# Patient Record
Sex: Male | Born: 1935 | Race: White | Hispanic: No | Marital: Married | Smoking: Never smoker
Health system: Southern US, Community
[De-identification: ages and names within clinical notes are randomized; demographics above are authoritative.]

## PROBLEM LIST (undated history)

## (undated) DIAGNOSIS — IMO0001 Reserved for inherently not codable concepts without codable children: Secondary | ICD-10-CM

## (undated) DIAGNOSIS — K219 Gastro-esophageal reflux disease without esophagitis: Secondary | ICD-10-CM

## (undated) DIAGNOSIS — I2510 Atherosclerotic heart disease of native coronary artery without angina pectoris: Secondary | ICD-10-CM

## (undated) DIAGNOSIS — I4891 Unspecified atrial fibrillation: Secondary | ICD-10-CM

## (undated) DIAGNOSIS — I059 Rheumatic mitral valve disease, unspecified: Secondary | ICD-10-CM

## (undated) DIAGNOSIS — I10 Essential (primary) hypertension: Secondary | ICD-10-CM

## (undated) DIAGNOSIS — I639 Cerebral infarction, unspecified: Secondary | ICD-10-CM

## (undated) DIAGNOSIS — I499 Cardiac arrhythmia, unspecified: Secondary | ICD-10-CM

## (undated) DIAGNOSIS — I209 Angina pectoris, unspecified: Secondary | ICD-10-CM

## (undated) DIAGNOSIS — I509 Heart failure, unspecified: Secondary | ICD-10-CM

## (undated) HISTORY — PX: EYE SURGERY: SHX253

## (undated) HISTORY — PX: CHOLECYSTECTOMY: SHX55

## (undated) HISTORY — PX: TONSILLECTOMY: SUR1361

## (undated) HISTORY — PX: OTHER SURGICAL HISTORY: SHX169

---

## 2004-04-18 ENCOUNTER — Ambulatory Visit: Payer: Self-pay | Admitting: Unknown Physician Specialty

## 2007-04-20 ENCOUNTER — Emergency Department: Payer: Self-pay | Admitting: Emergency Medicine

## 2007-04-23 ENCOUNTER — Ambulatory Visit: Payer: Self-pay | Admitting: Emergency Medicine

## 2007-06-18 ENCOUNTER — Ambulatory Visit: Payer: Self-pay | Admitting: Surgery

## 2007-06-25 ENCOUNTER — Inpatient Hospital Stay: Payer: Self-pay | Admitting: Surgery

## 2011-06-01 ENCOUNTER — Inpatient Hospital Stay: Payer: Self-pay | Admitting: Internal Medicine

## 2011-06-01 LAB — COMPREHENSIVE METABOLIC PANEL
Albumin: 3.4 g/dL (ref 3.4–5.0)
BUN: 22 mg/dL — ABNORMAL HIGH (ref 7–18)
Bilirubin,Total: 0.7 mg/dL (ref 0.2–1.0)
Chloride: 105 mmol/L (ref 98–107)
Co2: 26 mmol/L (ref 21–32)
Creatinine: 1.15 mg/dL (ref 0.60–1.30)
Glucose: 103 mg/dL — ABNORMAL HIGH (ref 65–99)
Potassium: 4 mmol/L (ref 3.5–5.1)
SGPT (ALT): 33 U/L
Sodium: 139 mmol/L (ref 136–145)

## 2011-06-01 LAB — CK TOTAL AND CKMB (NOT AT ARMC)
CK, Total: 44 U/L (ref 35–232)
CK-MB: 1.8 ng/mL (ref 0.5–3.6)
CK-MB: 1.4 ng/mL (ref 0.5–3.6)

## 2011-06-01 LAB — CBC
MCH: 34 pg (ref 26.0–34.0)
MCHC: 34.5 g/dL (ref 32.0–36.0)
Platelet: 149 10*3/uL — ABNORMAL LOW (ref 150–440)
RDW: 14.1 % (ref 11.5–14.5)

## 2011-06-01 LAB — TSH: Thyroid Stimulating Horm: 6.01 u[IU]/mL — ABNORMAL HIGH

## 2011-06-01 LAB — T4, FREE: Free Thyroxine: 0.88 ng/dL (ref 0.76–1.46)

## 2011-06-01 LAB — TROPONIN I: Troponin-I: 0.03 ng/mL

## 2011-06-02 LAB — CBC WITH DIFFERENTIAL/PLATELET
Basophil %: 0.5 %
HGB: 14.8 g/dL (ref 13.0–18.0)
MCH: 33 pg (ref 26.0–34.0)
Monocyte %: 11 %
Neutrophil #: 4.4 10*3/uL (ref 1.4–6.5)
Platelet: 142 10*3/uL — ABNORMAL LOW (ref 150–440)

## 2011-06-02 LAB — BASIC METABOLIC PANEL
Anion Gap: 7 (ref 7–16)
Chloride: 100 mmol/L (ref 98–107)
Co2: 30 mmol/L (ref 21–32)
EGFR (African American): 60 — ABNORMAL LOW
EGFR (Non-African Amer.): 52 — ABNORMAL LOW
Glucose: 87 mg/dL (ref 65–99)
Potassium: 4.2 mmol/L (ref 3.5–5.1)

## 2012-04-18 ENCOUNTER — Observation Stay: Payer: Self-pay | Admitting: Internal Medicine

## 2012-04-18 LAB — COMPREHENSIVE METABOLIC PANEL
Albumin: 3.1 g/dL — ABNORMAL LOW (ref 3.4–5.0)
Alkaline Phosphatase: 76 U/L (ref 50–136)
Anion Gap: 7 (ref 7–16)
BUN: 28 mg/dL — ABNORMAL HIGH (ref 7–18)
Calcium, Total: 8 mg/dL — ABNORMAL LOW (ref 8.5–10.1)
EGFR (African American): 43 — ABNORMAL LOW
EGFR (Non-African Amer.): 37 — ABNORMAL LOW
Glucose: 96 mg/dL (ref 65–99)
SGOT(AST): 27 U/L (ref 15–37)
SGPT (ALT): 17 U/L (ref 12–78)
Sodium: 139 mmol/L (ref 136–145)
Total Protein: 7 g/dL (ref 6.4–8.2)

## 2012-04-18 LAB — CBC
HCT: 40.4 % (ref 40.0–52.0)
HGB: 13.4 g/dL (ref 13.0–18.0)
MCH: 32.7 pg (ref 26.0–34.0)
MCHC: 33.3 g/dL (ref 32.0–36.0)
Platelet: 145 10*3/uL — ABNORMAL LOW (ref 150–440)
RDW: 13.9 % (ref 11.5–14.5)

## 2012-04-18 LAB — TROPONIN I
Troponin-I: <0.02 ng/mL
Troponin-I: <0.02 ng/mL

## 2012-04-18 LAB — CK TOTAL AND CKMB (NOT AT ARMC): CK-MB: 1.5 ng/mL (ref 0.5–3.6)

## 2012-04-19 LAB — MAGNESIUM: Magnesium: 2.1 mg/dL

## 2012-04-19 LAB — BASIC METABOLIC PANEL
Anion Gap: 4 — ABNORMAL LOW (ref 7–16)
BUN: 33 mg/dL — ABNORMAL HIGH (ref 7–18)
Calcium, Total: 9.1 mg/dL (ref 8.5–10.1)
Chloride: 103 mmol/L (ref 98–107)
Creatinine: 1.53 mg/dL — ABNORMAL HIGH (ref 0.60–1.30)
EGFR (African American): 45 — ABNORMAL LOW
Potassium: 4.5 mmol/L (ref 3.5–5.1)
Sodium: 138 mmol/L (ref 136–145)

## 2012-04-19 LAB — LIPID PANEL
Cholesterol: 168 mg/dL (ref 0–200)
HDL Cholesterol: 54 mg/dL (ref 40–60)
Ldl Cholesterol, Calc: 102 mg/dL — ABNORMAL HIGH (ref 0–100)
Triglycerides: 61 mg/dL (ref 0–200)
VLDL Cholesterol, Calc: 12 mg/dL (ref 5–40)

## 2014-05-26 NOTE — H&P (Signed)
PATIENT NAME:  Randy Chambers, Jasmeet W MR#:  [id number] DATE OF BIRTH:  10-Feb-1916  DATE OF ADMISSION:  04/18/2012  PRIMARY CARE PHYSICIAN:   Dr.  Beckey DowningWillett.   REFERRING PHYSICIAN:  Dr.  Margarita GrizzleWoodruff.  CHIEF COMPLAINT: Chest pain and shortness of breath since last night.   HISTORY OF PRESENT ILLNESS: A 81 year old Caucasian male with a history of hypertension, CHF, atrial flutter, GERD, presented to the ED with the above chief complaints. The patient is alert, awake, oriented, in no acute distress. The patient has said that he developed a pressure-like chest pain on the left side which is constant, associated with shortness of breath and a cough since last night. The patient denies any orthopnea, nocturnal dyspnea. Denies leg edema. Denies any weight gain. The patient denies any fever or chills. No palpitations. The patient's blood pressure, systolic, was 207 in the ED. Chest x-ray showed pulmonary edema. He was treated with nitroglycerin in the ED.   PAST MEDICAL HISTORY: Hypertension, CHF, diastolic dysfunction with ejection more than 55%, atrial flutter, GERD.   SOCIAL HISTORY: No smoking or drinking or illicit drugs.   FAMILY HISTORY: Hypertension, CAD.   SURGICAL HISTORY: No.  ALLERGIES: No.   HOME MEDICATIONS:  Trandolapril 4 mg p.o. daily, Prevacid 30 mg p.o. daily, Lasix 20 mg p.o. daily, aspirin 81 mg p.o. daily.   REVIEW OF SYSTEMS:  CONSTITUTIONAL: The patient denies any fever or chills. No headache or dizziness. No weakness.  EYES: No double vision or blurry vision.  ENT: No postnasal drip, slurred speech, or dysphagia. No epistaxis.  CARDIOVASCULAR: Positive for chest pressure, but no palpitations, orthopnea, or nocturnal dyspnea. No leg edema.  PULMONARY: Positive for cough, sputum, shortness of breath, but no hemoptysis.  GASTROINTESTINAL: No abdominal pain, nausea, vomiting, or diarrhea.  GENITOURINARY: No dysuria, hematuria, or incontinence.  SKIN: No rash or jaundice.   HEMATOLOGIC: No easy bruising or bleeding.  ENDOCRINE: No polyuria, polydipsia.  NEUROLOGIC: No syncope, loss of consciousness. No seizure.   PHYSICAL EXAMINATION: VITAL SIGNS: Temperature 98.2, blood pressure 163/79, pulse 63, respirations 18, O2 saturation 97% on room air.  GENERAL: The patient is alert, awake, oriented, in no acute distress.  HEENT: Pupils round, equal, reactive to light and accommodation. Moist oral mucosa. Clear oropharynx.  NECK: Supple. No JVD or carotid bruits. No lymphadenopathy. No thyromegaly.  CARDIOVASCULAR: S1, S2. Regular rate and rhythm. There is a systolic murmur about 3/6, radiation to the neck. No gallop. PULMONARY: Bilateral air entry. No wheezing or rales. No use of accessory muscles to breathe.  ABDOMEN: Soft. No distention or tenderness. No organomegaly. Bowel sounds present.  EXTREMITIES: Trace edema on bilateral lower extremeties, no clubbing, or cyanosis. No calf tenderness. Strong bilateral pedal pulses.  SKIN: No rash or jaundice.  NEUROLOGY: A  and O x3. No focal deficit. Power 5/5. Sensation intact.   LABORATORY DATA: Troponin less than 0.02. CK-MB 1.5, CK 77. CBC: WBC 7.2, hemoglobin 13.4, platelets 145, glucose 96, BUN 28, creatinine 1.58. Sodium 139, potassium 4.6, chloride 109, bicarbonate 23. BNP 5127. Chest x-ray shows pulmonary edema, possible small bilateral pleural effusions. Infection is not excluded. EKG shows atrial flutter at 65 BPM.   IMPRESSIONS: 1.  Acute on chronic congestive heart failure, diastolic dysfunction.  2.  Atrial flutter, rate controlled.  3.  Accelerated hypertension.  4.  Chest pain, possibly due to hypertension or atrial flutter.  5.  Acute renal failure.  6.  Coronary artery disease.  7.  Gastroesophageal reflux disease.  PLAN OF TREATMENT: 1.  The patient will be admitted to the telemetry floor. We will start O2 by nasal cannula. Follow up a troponin level. Continue aspirin, lisinopril, and check lipid  panel.  2.  We will start congestive heart failure protocol, give Lasix 40 mg IV. Follow up with Cardiology, Dr. Cleatis Polka.  3.  We will follow up BMP for acute renal failure.   I discussed the patient's condition and the plan of treatment with the patient and the patient's granddaughter.   THE PATIENT IS A FULL CODE.   TIME SPENT: About 55 minutes.    ____________________________ Shaune Pollack, MD qc:dm D: 04/18/2012 14:21:00 ET T: 04/18/2012 17:15:28 ET JOB#: [id number]  cc: Shaune Pollack, MD, <Dictator> Shaune Pollack MD ELECTRONICALLY SIGNED 04/19/2012 12:54

## 2014-05-26 NOTE — Consult Note (Signed)
Brief Consult Note: Diagnosis: 81 yo male with aflutter admitted with chest pain.   Patient was seen by consultant.   Consult note dictated.   Recommend further assessment or treatment.   Comments: 81 yo male with history of aflutter admitted with chest pain. EKG does not reveal any signficant ischemia. Has ruled out for an mi thus far. Will review out patient workup and proceed with a non invasive evaluation to include echo initially. Continue current meds for rate control and -pain relief. Further recs pending course.  Electronic Signatures: Dalia HeadingFath, Crixus Mcaulay A (MD)  (Signed 17-Mar-14 07:56)  Authored: Brief Consult Note   Last Updated: 17-Mar-14 07:56 by Dalia HeadingFath, Lesha Jager A (MD)

## 2014-05-26 NOTE — Discharge Summary (Signed)
PATIENT NAME:  Randy Chambers, Paxson W MR#:  [id number] DATE OF BIRTH:  09/18/1916  DATE OF ADMISSION:  04/18/2012 DATE OF DISCHARGE:  04/19/2012  PRIMARY CARE PHYSICIAN:  Barry BrunnerGlenn Willett.  DISCHARGE DIAGNOSES: 1.  Acute on chronic diastolic congestive heart failure.  2.  Atrial flutter.  3.  Accelerated hypertension.  4.  Chest pain secondary to atrial flutter.  5.  Chronic kidney disease stage III.  6.  Coronary artery disease.  7.  Gastroesophageal reflux disease.   CONSULTS: Dr. Lady GaryFath of Cardiology.   IMAGING STUDIES DONE: Include a chest x-ray, portable, which showed pulmonary edema with small bilateral pleural effusions.   ADMITTING HISTORY AND PHYSICAL AND HOSPITAL COURSE: Please see detailed  H and P dictated by Dr. Imogene Burnhen on 04/18/2012. In brief, a 81 year old Caucasian male patient with history of coronary artery disease, CHF, who presented to the hospital complaining of shortness of breath and chest pain. The patient was admitted to the hospitalist service with CHF and rule out acute coronary syndrome.   HOSPITAL COURSE: 1.  Chest pain: This was thought to be secondary to his acute on chronic diastolic congestive heart failure. The patient was diuresed with IV Lasix, with which his symptoms improved. His chest pain had resolved, and the patient is being discharged home on 40 mg of Lasix instead of 20 and that he was taking at home. He was also advised fluid restriction and a low-salt diet.  2.  Acute on chronic diastolic congestive heart failure: See above. The patient was also started on lisinopril and Coreg for his hypertension and congestive heart failure.  3.  Uncontrolled hypertension: Well controlled on the medication started. Blood pressure was 139/81 prior to discharge.   On the day of discharge, the patient worked with physical therapy. Advised to have further physical therapy with home health. Blood pressure was 139/79. Examination showed a pan-systolic murmur, with echo showing  severe mitral regurgitation. The patient was discharged home in fair condition, to follow up with Dr. Lady GaryFath of Cardiology.   DISCHARGE MEDICATIONS: Include: 1.  Aspirin 81 mg oral once a day.  2.  Prevacid 30 mg oral once a day.  3.  Trandolapril 4 mg oral once a day.  4.  Lasix 40 mg oral once a day.  5.  Coreg 3.125 mg oral twice a day.   DISCHARGE INSTRUCTIONS: The patient was set up with home health for physical therapy and nurse at home. Low-sodium, fluid-restricted diet. Activity as tolerated using his walker.   Follow up with primary care physician and Dr. Lady GaryFath in 1 to 2 weeks.   Time spent on day of discharge and discharge activity was 35 minutes.     ____________________________ Molinda BailiffSrikar R. Jeweldean Drohan, MD srs:dm D: 04/20/2012 13:49:00 ET T: 04/20/2012 14:07:39 ET JOB#: [id number]  cc: Sherrine MaplesGlenn R. Beckey DowningWillett, MD Darlin PriestlyKenneth A. Lady GaryFath, MD Lorilynn Lehr R. Jameis Newsham, MD, <Dictator>   Orie FishermanSRIKAR R Abdelrahman Nair MD ELECTRONICALLY SIGNED 04/22/2012 13:21

## 2014-05-26 NOTE — Consult Note (Signed)
General Aspect 81 yo male with history of atrial flutter, diastollic heart failure with echo in the past revealing ef of 55%, hypertension who was admitted aftrer waking up from sleep sunday am with mid sternal chest pain. He states he has not felt this previously. He presented to the er where he was noted to have a normal troponin with no evidence of significant chf. He reports compliance with his medications. He has had no further pain since his admission and is resting comfortably at present. Has not noted exertional pain recently . Has history of mitral valve disease with echo in 2013 revealing severe mr with preserved lv funciton   Physical Exam:  GEN well developed, well nourished, no acute distress   HEENT hearing intact to voice   NECK supple   RESP normal resp effort  clear BS  no use of accessory muscles   CARD Irregular rate and rhythm  Normal, S1, S2  Murmur   Murmur Systolic   Systolic Murmur axilla   ABD denies tenderness  no hernia   LYMPH negative neck, negative axillae   EXTR negative cyanosis/clubbing, negative edema   SKIN normal to palpation, No rashes   NEURO cranial nerves intact, motor/sensory function intact   PSYCH A+O to time, place, person   Review of Systems:  Subjective/Chief Complaint midsternal chest pain   General: No Complaints   Skin: No Complaints   ENT: No Complaints   Eyes: No Complaints   Neck: No Complaints   Respiratory: No Complaints   Cardiovascular: Chest pain or discomfort  Tightness   Gastrointestinal: No Complaints   Genitourinary: No Complaints   Vascular: No Complaints   Musculoskeletal: No Complaints   Neurologic: No Complaints   Hematologic: No Complaints   Endocrine: No Complaints   Psychiatric: No Complaints   Review of Systems: All other systems were reviewed and found to be negative   Medications/Allergies Reviewed Medications/Allergies reviewed     CHF:    htn:    acid reflux:     Prostate Reduction:    Esophageal Dilation:    Tonsillectomy:        Admit Diagnosis:   CHEST PAIN: Onset Date: 19-Apr-2012, Status: Active, Description: CHEST PAIN  Home Medications: Medication Instructions Status  aspirin 81 mg oral tablet 1 tab(s) orally once a day Active  Prevacid 30 mg oral delayed release capsule 1 cap(s) orally once a day Active  furosemide 20 mg oral tablet 1 tab(s) orally once a day Active  trandolapril 4 mg oral tablet 1 tab(s) orally once a day for high blood pressure. Active   EKG:  Abnormal NSSTTW changes   Interpretation afib with nssttw changes.    No Known Allergies:    Impression 81 yo male with history of afib/flutter, hypertension, diastollic chf who was admitted with chest pain and shortness of breath. CXR reveals bilateral small effusions consistant with chf. Based on previous echo, this appears to be diastollic chf. He also has a 3/6 systollic murmur which may be exacerbating his chf. Will need echo to better assess his lv funciton and valves.  Appears to have ruled out for an mi thus far. Has CKD III.   Plan 1. Continue with current meds 2. Careful diuresis following electrolytes and renal function 3. Echo to evaluate lv funciton, diastology and valvular function 4. Furhter recs pending coursse.   Electronic Signatures: Dalia Heading (MD)  (Signed 17-Mar-14 09:36)  Authored: General Aspect/Present Illness, History and Physical Exam, Review of System,  Past Medical History, Health Issues, Home Medications, EKG , Allergies, Impression/Plan   Last Updated: 17-Mar-14 09:36 by Dalia HeadingFath, Kenneth A (MD)

## 2014-05-28 NOTE — Consult Note (Signed)
General Aspect 81 year old male with history of valvular heart disease and hypertension who was admitted after presenting to the emergency room with increasing shortness of breath. Patient is a somewhat difficult historian but noted increasing shortness of breath of the last several weeks. This apparently worsened on day of admission. Patient is ruled out for myocardial infarction. Chest x-ray does not reveal significant congestive heart failure. He does have a systolic murmur on exam. He is currently hemodynamically stable and resting comfortably in bed. He denies any further shortness of breath. He denies chest pain. He states he has been compliant with his medications.he was noted to have what was felt to be atrial flutter on admission EKG   Physical Exam:   GEN no acute distress    HEENT PERRL, hearing intact to voice    NECK supple    RESP clear BS  no use of accessory muscles    CARD Regular rate and rhythm  Murmur    Murmur Systolic    Systolic Murmur Out flow    ABD denies tenderness  normal BS  no Abdominal Bruits    LYMPH negative neck, negative axillae    EXTR negative cyanosis/clubbing, negative edema    SKIN normal to palpation    NEURO cranial nerves intact, motor/sensory function intact    PSYCH A+O to time, place, person   Review of Systems:   Subjective/Chief Complaint shortness of breath and fatigue    General: Fatigue    Skin: No Complaints    ENT: No Complaints    Eyes: No Complaints    Neck: No Complaints    Respiratory: Short of breath    Cardiovascular: No Complaints    Gastrointestinal: No Complaints    Genitourinary: No Complaints    Vascular: No Complaints    Musculoskeletal: No Complaints    Neurologic: No Complaints    Hematologic: No Complaints    Endocrine: No Complaints    Psychiatric: No Complaints    Review of Systems: All other systems were reviewed and found to be negative    Medications/Allergies Reviewed  Medications/Allergies reviewed        Admit Diagnosis:   CHF A FLUTTER: 01-Jun-2011, Active, CHF A FLUTTER      Admit Reason:   Nausea: (787.02) Active, ICD9, Nausea alone   Chest pain: (786.50) Active, ICD9, Unspecified chest pain   CHF (congestive heart failure): (428.0) Active, ICD9, Congestive heart failure, unspecified   Atrial flutter: (427.32) Active, ICD9, Atrial flutter  Home Medications: Medication Instructions Status  aspirin 81 mg oral tablet 1 tab(s) orally once a day Active  Prevacid 30 mg oral delayed release capsule 1 cap(s) orally once a day Active  trandolapril 4 mg oral tablet 1 tab(s) orally once a day Active   EKG:   Interpretation sinus rhythm with intermittent atrial flutter/fibrillation    No Known Allergies:     Impression 81 year old male with presentation with increasing shortness of breath. Admission electrocardiogram revealed possible atrial flutter. He also was noted to have borderline pulmonary edema on chest x-ray. On exam he has a systolic murmur. Patient is had an irregular heartbeat in the past but is not aware of ever having the diagnosis of atrial fibrillation/flutter. EKG at present on telemetry reveals sinus rhythm with first-degree AV block. He is currently hemodynamically stable. He is ruled out for myocardial infarction thus far. Would not proceed with chronic anticoagulation at present following for evidence of significant arrhythmia. We'll also compare echocardiogram done in the office  to evaluate extent of valvular heart disease    Plan 1. Continue with current medical therapy ruling out for myocardial infarction 2. Continue on telemetry following for evidence of arrhythmia 3. Would not proceed with chronic anticoagulation at present unless the patient develops further sustained atrial arrhythmia 4. Further recommendations pending hospital course   Electronic Signatures: Dalia HeadingFath, Wiletta Bermingham A (MD)  (Signed 28-Apr-13 16:16)  Authored: General  Aspect/Present Illness, History and Physical Exam, Review of System, Health Issues, Home Medications, EKG , Allergies, Impression/Plan   Last Updated: 28-Apr-13 16:16 by Dalia HeadingFath, Mabeline Varas A (MD)

## 2014-05-28 NOTE — Discharge Summary (Signed)
PATIENT NAME:  Randy Chambers, Randy Chambers MR#:  [id number] DATE OF BIRTH:  24-Jan-1917  DATE OF ADMISSION:  06/01/2011 DATE OF DISCHARGE:  06/02/2011  ADMITTING DIAGNOSIS: Chest pain.   DISCHARGE DIAGNOSES:  1. Chest pain, negative cardiac enzymes for injury.  2. Known coronary artery disease, stable at this time. No further evaluation recommended by Cardiology.  3. Atrial flutter, slow ventricular response.  4. Congestive heart failure, left heart, acute.  5. Nausea, resolved. 6. History of hypertension.   DISCHARGE CONDITION: Stable.   DISCHARGE MEDICATIONS: The patient is to resume his outpatient medications which are:  1. Aspirin 81 mg p.o. daily.  2. Prevacid 30 mg p.o. daily.   ADDITIONAL MEDICATIONS:  1. Trandolapril 4 mg p.o. twice daily, this is new dose. 2. Lasix 20 mg p.o. daily.  HOME OXYGEN: None.   DIET: 2 gram salt, low fat.   ACTIVITY LIMITATIONS: As tolerated.   FOLLOW-UP: 1. Follow-up appointment with Randy Chambers in two days after discharge. 2. Follow-up with Randy Chambers in two days after discharge.     CONSULTANTS:  1. Randy Chambers  2. Randy Chambers   RADIOLOGIC STUDIES:  1. Chest x-ray, portable, single view, 06/01/2011, showed overall findings concerning for pulmonary edema versus interstitial pneumonitis secondary to an infectious or inflammatory etiology. Right lower lobe airspace disease which may represent atelectasis versus pneumonia. 2. Repeat chest x-ray, PA and lateral, 06/02/2011, showed persistent changes of CHF with there being persistent bibasilar pleural effusions and increased density at the right base compatible with interstitial and pulmonary edema according to the radiologist.   HISTORY OF PRESENT ILLNESS: The patient is a 81 year old Caucasian male with past medical history significant for history of hypertension who presented to the hospital with complaints of chest pain as well as nausea and dyspnea. Please refer to Randy Chambers' admission note on  06/01/2011.   PHYSICAL EXAMINATION: On arrival to the Emergency Room, the patient's blood pressure was elevated to 151/84, heart rate was 70, respiration rate was 18. He was afebrile. Physical exam revealed rales approximately one-third of the way up bilaterally. No wheezes or rhonchi were noted. Systolic murmur was also noted on heart exam. His lower extremities did not show any edema.   LAB DATA: Markedly elevated B-type Natriuretic Peptide at 4435. Glucose 103, BUN 22, otherwise BMP was unremarkable. The patient's liver enzymes were normal. Cardiac enzymes x3 were normal. The patient's TSH was elevated to 6.01, however, the patient's free T3 as well as free T4 were within normal limits. CBC was within normal limits except platelet count was slightly low at 149.   HOSPITAL COURSE: The patient was admitted to the hospital. He was started on IV Lasix and he diuresed over the next few days while he was in the hospital. In fact, over the hospital stay he diuresed approximately 1850 mL. After diuresis he felt satisfactory, did not complain of any significant discomfort, chest pains, or shortness of breath. He was able to ambulate with no significant distress in his room as well as around nursing station and his oxygen saturation remained stable at 93 to 94% on room air at rest as well as on exertion. He was seen by Randy Chambers as well as Randy Chambers who followed him along. His atrial flutter was recommended to be continued at this time with no rhythm or rate regulating agents because of his slow ventricular response. He was also recommended not to be started on anticoagulation therapy. No other studies or interventions were recommended for his  chest pains. The patient is to follow-up with Randy Chambers as well as Dr. Beckey Chambers in the next few days after discharge.  1. In regards to hypertension, the patient's blood pressure was noted to be somewhat elevated and his trandolapril blood pressure medication was advanced  to maximum dose 4 mg p.o. twice daily dose. The patient is to follow-up with his primary care physician as well as cardiologist for further recommendations.  2. In regards to nausea, the patient's nausea resolved in the first 12 hours after admission.   The patient is being discharged in stable condition with the above-mentioned medications and follow-up.   VITAL SIGNS ON DAY OF DISCHARGE: Temperature 97.7, pulse 67 to 72, respiration rate 18 to 19,  blood pressure fluctuating between 99 to 147 systolic and 60's to 90's diastolic, and oxygen saturation was 93 to 94% as mentioned above on room air at rest as well as on exertion.   TIME SPENT: 40 minutes.   Of note, the patient's echocardiogram was performed on 06/02/2011 which showed left ventricular systolic function normal, ejection fraction 55%, left atrium was found to be mildly dilated, right atrium was mildly dilated. There was moderate to severe mitral regurgitation, moderate tricuspid regurgitation, as well as mild aortic regurgitation.    TIME SPENT: 40 minutes.   ____________________________ Randy Chambers Randy Sadowski, MD rv:drc D: 06/02/2011 15:34:30 ET T: 06/03/2011 10:41:53 ET JOB#: [id number]  cc: Randy Chambers Randy Borges, MD, <Dictator> Randy GuildGlenn R. Beckey DowningWillett, MD Randy BlinksBruce J. Kowalski, MD Randy Infantino MD ELECTRONICALLY SIGNED 06/14/2011 14:15

## 2014-05-28 NOTE — H&P (Signed)
PATIENT NAME:  Randy Chambers, Randy Chambers MR#:  [id number] DATE OF BIRTH:  1916/10/28  DATE OF ADMISSION:  06/01/2011  REFERRING PHYSICIAN: Dr. Olivia MackieGina Martin.   FAMILY PHYSICIAN: Dr. Beckey DowningWillett.   REASON FOR ADMISSION: Chest pain with nausea and dyspnea.   HISTORY OF PRESENT ILLNESS: The patient is a 81 year old male followed by Dr. Beckey DowningWillett with a history of hypertension and reflux. He presents to the Emergency Room with progressive dyspnea, fatigue, with nausea. He also has been having intermittent chest pain, having heaviness in his arms as well. Symptoms are mostly exertional. In the Emergency Room, the patient was noted to be in rate-controlled atrial flutter. Chest x-ray suggested pulmonary edema/congestive heart failure. He is now admitted for further evaluation. Denies previous cardiac history.   PAST MEDICAL HISTORY:  1. Benign hypertension.  2. Gastroesophageal reflux disease.   MEDICATIONS:  1. Mavik 4 mg p.o. daily.  2. Prevacid 30 mg p.o. daily.  3. Aspirin 81 mg p.o. daily.   ALLERGIES: No known drug allergies.   SOCIAL HISTORY: Negative for alcohol or tobacco abuse.   FAMILY HISTORY: Positive for coronary artery disease and hypertension.  REVIEW OF SYSTEMS: CONSTITUTIONAL: No fever or change in weight. EYES: No blurred or double vision. No glaucoma. ENT: No tinnitus or hearing loss. No nasal discharge or bleeding. No difficulty swallowing. RESPIRATORY: No cough or wheezing. No painful respiration. Denies hemoptysis. CARDIOVASCULAR: No palpitations. No syncope. GASTROINTESTINAL: No vomiting or diarrhea. No abdominal pain. No change in bowel habits. GU: No dysuria or hematuria. No incontinence. ENDOCRINE: No polyuria or polydipsia. No heat or cold intolerance. HEMATOLOGIC: The patient denies anemia, easy bruising, or bleeding. LYMPHATIC: No swollen glands. MUSCULOSKELETAL: The patient denies pain in his neck, back, shoulders, knees or hips. No gout. NEUROLOGIC: No numbness although he does feel  weak. Denies migraines, strokes or seizures. PSYCH: The patient denies anxiety, insomnia, or depression.   PHYSICAL EXAMINATION:    GENERAL: The patient is elderly, in no acute distress.   VITAL SIGNS: Blood pressure 151/84 with a heart rate of 70 and respiratory rate of 18. He is afebrile.   HEENT: Normocephalic, atraumatic. Pupils are equal, round and reactive to light and accommodation. Extraocular movements are intact. Sclera anicteric. Conjunctivae are clear. Oropharynx is clear.   NECK: Supple without jugular venous distention or bruits. No lymphadenopathy or thyromegaly is noted.   LUNGS: Rales approximately one-third of the way up bilaterally. No wheezes or rhonchi. No dullness.   HEART: Regular rate and rhythm with a normal S1 and S2. There is a III/VI systolic murmur noted. No rubs or gallops were present.   ABDOMEN: Soft and nontender with normoactive bowel sounds. No organomegaly or mass were appreciated. No hernias or bruits were noted.   EXTREMITIES: Without clubbing, cyanosis or edema. Pulses were 1+ bilaterally.   SKIN: Warm and dry without rash or lesions.   NEUROLOGIC: Cranial nerves II through XII grossly intact. Deep tendon reflexes were symmetric. Motor and sensory examination is nonfocal.   PSYCH: Exam revealed a patient who is alert and oriented to person, place, and time. He was cooperative and used good judgment.   LABORATORY, DIAGNOSTIC AND RADIOLOGICAL DATA: CBC revealed a white count of 8.1 with a hemoglobin of 15.4. Glucose was 103 with a BUN of 22 and a creatinine of 1.15 with a sodium of 139 and a potassium of 4.0 and a GFR of 55. Total CK was 44 with an MB of 1.8 and a troponin of 0.03. Chest x-ray revealed pulmonary  edema. EKG revealed atrial flutter with a rate of 72 beats per minute. No acute ischemic changes were noted.   ASSESSMENT:  1. New onset atrial flutter, rate controlled.  2. Congestive heart failure/pulmonary edema.  3. Chest pain worrisome  for unstable angina.  4. Mild renal insufficiency.  5. Benign hypertension.   PLAN:  1. The patient will be admitted to the to telemetry floor with aspirin, Lovenox, and topical nitrates. 2. We will begin IV Lasix for diuresis.  3. We will supplement oxygen and wean as tolerated.  4. We will follow serial cardiac enzymes.  5. We will obtain an echocardiogram.  6. Consult cardiology.  7. Follow-up chest x-ray and blood work tomorrow.  8. Further treatment and evaluation will depend upon the patient's progress.   TOTAL TIME SPENT ON THIS PATIENT: 50 minutes.   ____________________________ Duane Lope Judithann Sheen, MD jds:ap D: 06/01/2011 09:00:22 ET T: 06/01/2011 10:12:27 ET JOB#: [id number]  cc: Duane Lope. Judithann Sheen, MD, <Dictator> Jorje Guild. Beckey Downing, MD Ndea Kilroy Rodena Medin MD ELECTRONICALLY SIGNED 06/01/2011 20:15

## 2014-06-06 ENCOUNTER — Other Ambulatory Visit: Payer: Self-pay | Admitting: Unknown Physician Specialty

## 2014-06-06 ENCOUNTER — Ambulatory Visit
Admission: RE | Admit: 2014-06-06 | Discharge: 2014-06-06 | Disposition: A | Discharge status: Home / Self Care | Payer: Medicare Other | Source: Ambulatory Visit | Attending: Unknown Physician Specialty | Admitting: Unknown Physician Specialty

## 2014-06-06 DIAGNOSIS — R1310 Dysphagia, unspecified: Secondary | ICD-10-CM | POA: Insufficient documentation

## 2014-06-06 DIAGNOSIS — K449 Diaphragmatic hernia without obstruction or gangrene: Secondary | ICD-10-CM | POA: Insufficient documentation

## 2014-06-08 ENCOUNTER — Ambulatory Visit
Admission: RE | Admit: 2014-06-08 | Discharge: 2014-06-08 | Disposition: A | Discharge status: Home / Self Care | Payer: Medicare Other | Source: Ambulatory Visit | Attending: Unknown Physician Specialty | Admitting: Unknown Physician Specialty

## 2014-06-08 ENCOUNTER — Encounter
Admission: RE | Disposition: A | Discharge status: Home / Self Care | Payer: Self-pay | Source: Ambulatory Visit | Attending: Unknown Physician Specialty

## 2014-06-08 ENCOUNTER — Ambulatory Visit: Payer: Medicare Other | Admitting: Anesthesiology

## 2014-06-08 ENCOUNTER — Other Ambulatory Visit: Payer: Self-pay | Admitting: Unknown Physician Specialty

## 2014-06-08 ENCOUNTER — Encounter: Payer: Self-pay | Admitting: *Deleted

## 2014-06-08 DIAGNOSIS — I10 Essential (primary) hypertension: Secondary | ICD-10-CM | POA: Insufficient documentation

## 2014-06-08 DIAGNOSIS — I4891 Unspecified atrial fibrillation: Secondary | ICD-10-CM | POA: Diagnosis not present

## 2014-06-08 DIAGNOSIS — Z79899 Other long term (current) drug therapy: Secondary | ICD-10-CM | POA: Diagnosis not present

## 2014-06-08 DIAGNOSIS — K222 Esophageal obstruction: Secondary | ICD-10-CM | POA: Insufficient documentation

## 2014-06-08 DIAGNOSIS — I2510 Atherosclerotic heart disease of native coronary artery without angina pectoris: Secondary | ICD-10-CM | POA: Insufficient documentation

## 2014-06-08 DIAGNOSIS — R933 Abnormal findings on diagnostic imaging of other parts of digestive tract: Secondary | ICD-10-CM | POA: Insufficient documentation

## 2014-06-08 DIAGNOSIS — R1310 Dysphagia, unspecified: Secondary | ICD-10-CM | POA: Diagnosis present

## 2014-06-08 DIAGNOSIS — Z7982 Long term (current) use of aspirin: Secondary | ICD-10-CM | POA: Diagnosis not present

## 2014-06-08 HISTORY — DX: Cardiac arrhythmia, unspecified: I49.9

## 2014-06-08 HISTORY — DX: Gastro-esophageal reflux disease without esophagitis: K21.9

## 2014-06-08 HISTORY — DX: Reserved for inherently not codable concepts without codable children: IMO0001

## 2014-06-08 HISTORY — DX: Heart failure, unspecified: I50.9

## 2014-06-08 HISTORY — PX: ESOPHAGOGASTRODUODENOSCOPY: SHX5428

## 2014-06-08 HISTORY — DX: Essential (primary) hypertension: I10

## 2014-06-08 SURGERY — EGD (ESOPHAGOGASTRODUODENOSCOPY)
Anesthesia: Monitor Anesthesia Care

## 2014-06-08 MED ORDER — PROPOFOL INFUSION 10 MG/ML OPTIME
INTRAVENOUS | Status: DC | PRN
  Administered 2014-06-08: 25 ug/kg/min via INTRAVENOUS

## 2014-06-08 MED ORDER — LACTATED RINGERS IV SOLN
INTRAVENOUS | Status: DC
  Administered 2014-06-08: 1000 mL via INTRAVENOUS
  Administered 2014-06-08: 09:00:00 via INTRAVENOUS

## 2014-06-08 MED ORDER — SODIUM CHLORIDE 0.9 % IV SOLN: INTRAVENOUS | Status: DC

## 2014-06-08 MED ORDER — LIDOCAINE HCL (CARDIAC) 20 MG/ML IV SOLN
INTRAVENOUS | Status: DC | PRN
Start: 2014-06-08 — End: 2014-06-08
  Administered 2014-06-08: 30 mg via INTRAVENOUS

## 2014-06-08 MED ORDER — BUTAMBEN-TETRACAINE-BENZOCAINE 2-2-14 % EX AERO
INHALATION_SPRAY | CUTANEOUS | Status: DC | PRN
  Administered 2014-06-08: 1 via TOPICAL

## 2014-06-08 NOTE — Transfer of Care (Signed)
Immediate Anesthesia Transfer of Care Note  Patient: Randy Chambers  Procedure(s) Performed: Procedure(s): ESOPHAGOGASTRODUODENOSCOPY (EGD) (N/A)  Patient Location: PACU  Anesthesia Type:MAC  Level of Consciousness: awake  Airway & Oxygen Therapy: Patient Spontanous Breathing  Post-op Assessment: Report given to RN  Post vital signs: Reviewed and stable  Last Vitals:  Filed Vitals:   06/08/14 0851  BP: 122/76  Pulse: 50  Temp: 36.3 C  Resp: 18    Complications: No apparent anesthesia complications

## 2014-06-08 NOTE — H&P (Signed)
The recent H&P (dated 06/06/2014 ) was reviewed, the patient was examined and there is no change in the patients condition since that H&P was completed.   Randy Chambers, [name]  06/08/2014, 9:32 AM

## 2014-06-08 NOTE — Anesthesia Preprocedure Evaluation (Signed)
Anesthesia Evaluation  Patient identified by MRN, date of birth, ID band Patient awake    Reviewed: Allergy & Precautions, NPO status , Patient's Chart, lab work & pertinent test results, reviewed documented beta blocker date and time   Airway Mallampati: II  TM Distance: >3 FB     Dental  (+) Lower Dentures, Partial Upper   Pulmonary          Cardiovascular     Neuro/Psych    GI/Hepatic   Endo/Other    Renal/GU      Musculoskeletal   Abdominal   Peds  Hematology   Anesthesia Other Findings   Reproductive/Obstetrics                             Anesthesia Physical Anesthesia Plan  ASA: III  Anesthesia Plan: MAC   Post-op Pain Management:    Induction: Intravenous  Airway Management Planned: Nasal Cannula  Additional Equipment:   Intra-op Plan:   Post-operative Plan:   Informed Consent: I have reviewed the patients History and Physical, chart, labs and discussed the procedure including the risks, benefits and alternatives for the proposed anesthesia with the patient or authorized representative who has indicated his/her understanding and acceptance.     Plan Discussed with:   Anesthesia Plan Comments:         Anesthesia Quick Evaluation

## 2014-06-08 NOTE — Anesthesia Postprocedure Evaluation (Signed)
  Anesthesia Post-op Note  Patient: Randy Chambers  Procedure(s) Performed: Procedure(s): ESOPHAGOGASTRODUODENOSCOPY (EGD) (N/A)  Anesthesia type:MAC  Patient location: PACU  Post pain: Pain level controlled  Post assessment: Post-op Vital signs reviewed, Patient's Cardiovascular Status Stable, Respiratory Function Stable, Patent Airway and No signs of Nausea or vomiting  Post vital signs: Reviewed and stable  Last Vitals:  Filed Vitals:   06/08/14 0851  BP: 122/76  Pulse: 50  Temp: 36.3 C  Resp: 18    Level of consciousness: awake, alert  and patient cooperative  Complications: No apparent anesthesia complications  

## 2014-06-08 NOTE — Op Note (Signed)
[facility] Gastroenterology Patient Name: Randy BubaCharles Chambers Procedure Date: 06/08/2014 9:30 AM MRN: [id number] Account #: [id number] Date of Birth: 05/23/1916 Admit Type: Outpatient Age: [age] Room: [facility] ENDO ROOM 1 Gender: Male Note Status: Finalized Procedure:         Upper GI endoscopy Indications:       Dysphagia, Abnormal UGI series Providers:         Scot Junobert T. Tynika Luddy, MD Referring MD:      No Local Md, MD (Referring MD) Medicines:         Propofol per Anesthesia Complications:     No immediate complications. Procedure:         Pre-Anesthesia Assessment:                    - After reviewing the risks and benefits, the patient was                     deemed in satisfactory condition to undergo the procedure.                    After obtaining informed consent, the endoscope was passed                     under direct vision. Throughout the procedure, the                     patient's blood pressure, pulse, and oxygen saturations                     were monitored continuously. The Endoscope was introduced                     through the mouth, and advanced to the second part of                     duodenum. The upper GI endoscopy was accomplished without                     difficulty. The patient tolerated the procedure well. Findings:      A moderate Schatzki ring (acquired) was found at the gastroesophageal       junction. After exam of the stomach and duodnum A guidewire was placed       and the scope was withdrawn. Dilation was performed with a Savary       dilator with mild resistance at 16 mm and 17 mm.      The stomach was normal.      The examined duodenum was normal. Impression:        - Moderate Schatzki ring. Dilated.                    - Normal stomach.                    - Normal examined duodenum.                    - No specimens collected. Recommendation:    - soft food for 3 days, eat slowly, chew well, take small                     bites, take  medicine. Scot Junobert T Dhanya Bogle, MD 06/08/2014 9:45:07 AM This report has been signed electronically. Number of Addenda: 0 Note Initiated On: 06/08/2014 9:30 AM  [facility]

## 2014-06-08 NOTE — Anesthesia Postprocedure Evaluation (Signed)
  Anesthesia Post-op Note  Patient: Randy Chambers  Procedure(s) Performed: Procedure(s): ESOPHAGOGASTRODUODENOSCOPY (EGD) (N/A)  Anesthesia type:MAC  Patient location: PACU  Post pain: Pain level controlled  Post assessment: Post-op Vital signs reviewed, Patient's Cardiovascular Status Stable, Respiratory Function Stable, Patent Airway and No signs of Nausea or vomiting  Post vital signs: Reviewed and stable  Last Vitals:  Filed Vitals:   06/08/14 0851  BP: 122/76  Pulse: 50  Temp: 36.3 C  Resp: 18    Level of consciousness: awake, alert  and patient cooperative  Complications: No apparent anesthesia complications

## 2014-06-09 ENCOUNTER — Encounter: Payer: Self-pay | Admitting: Unknown Physician Specialty

## 2014-09-12 ENCOUNTER — Emergency Department: Payer: Medicare Other

## 2014-09-12 ENCOUNTER — Encounter: Payer: Self-pay | Admitting: *Deleted

## 2014-09-12 ENCOUNTER — Emergency Department
Admission: EM | Admit: 2014-09-12 | Discharge: 2014-09-12 | Disposition: A | Discharge status: Home / Self Care | Payer: Medicare Other | Attending: Emergency Medicine | Admitting: Emergency Medicine

## 2014-09-12 DIAGNOSIS — Z7982 Long term (current) use of aspirin: Secondary | ICD-10-CM | POA: Diagnosis not present

## 2014-09-12 DIAGNOSIS — N390 Urinary tract infection, site not specified: Secondary | ICD-10-CM | POA: Insufficient documentation

## 2014-09-12 DIAGNOSIS — R0600 Dyspnea, unspecified: Secondary | ICD-10-CM | POA: Diagnosis not present

## 2014-09-12 DIAGNOSIS — I10 Essential (primary) hypertension: Secondary | ICD-10-CM | POA: Diagnosis not present

## 2014-09-12 DIAGNOSIS — Z79899 Other long term (current) drug therapy: Secondary | ICD-10-CM | POA: Insufficient documentation

## 2014-09-12 DIAGNOSIS — R0602 Shortness of breath: Secondary | ICD-10-CM | POA: Diagnosis present

## 2014-09-12 HISTORY — DX: Unspecified atrial fibrillation: I48.91

## 2014-09-12 HISTORY — DX: Atherosclerotic heart disease of native coronary artery without angina pectoris: I25.10

## 2014-09-12 HISTORY — DX: Rheumatic mitral valve disease, unspecified: I05.9

## 2014-09-12 HISTORY — DX: Cerebral infarction, unspecified: I63.9

## 2014-09-12 HISTORY — DX: Angina pectoris, unspecified: I20.9

## 2014-09-12 LAB — CBC WITH DIFFERENTIAL/PLATELET
BASOS PCT: 0 %
Basophils Absolute: 0 10*3/uL (ref 0–0.1)
EOS ABS: 0.1 10*3/uL (ref 0–0.7)
Eosinophils Relative: 1 %
HEMATOCRIT: 40.7 % (ref 40.0–52.0)
Hemoglobin: 13.8 g/dL (ref 13.0–18.0)
Lymphocytes Relative: 11 %
Lymphs Abs: 1.2 10*3/uL (ref 1.0–3.6)
MCH: 33.3 pg (ref 26.0–34.0)
MCHC: 34 g/dL (ref 32.0–36.0)
MCV: 97.9 fL (ref 80.0–100.0)
MONOS PCT: 8 %
Monocytes Absolute: 1 10*3/uL (ref 0.2–1.0)
NEUTROS ABS: 9.1 10*3/uL — AB (ref 1.4–6.5)
Neutrophils Relative %: 80 %
Platelets: 180 10*3/uL (ref 150–440)
RBC: 4.16 MIL/uL — AB (ref 4.40–5.90)
RDW: 15.3 % — ABNORMAL HIGH (ref 11.5–14.5)
WBC: 11.5 10*3/uL — AB (ref 3.8–10.6)

## 2014-09-12 LAB — COMPREHENSIVE METABOLIC PANEL
ALK PHOS: 64 U/L (ref 38–126)
ALT: 15 U/L — AB (ref 17–63)
ANION GAP: 11 (ref 5–15)
AST: 37 U/L (ref 15–41)
Albumin: 2.9 g/dL — ABNORMAL LOW (ref 3.5–5.0)
BUN: 22 mg/dL — AB (ref 6–20)
CALCIUM: 8.2 mg/dL — AB (ref 8.9–10.3)
CHLORIDE: 96 mmol/L — AB (ref 101–111)
CO2: 27 mmol/L (ref 22–32)
Creatinine, Ser: 1.26 mg/dL — ABNORMAL HIGH (ref 0.61–1.24)
GFR calc Af Amer: 54 mL/min — ABNORMAL LOW (ref >60)
GFR calc non Af Amer: 46 mL/min — ABNORMAL LOW (ref >60)
Glucose, Bld: 118 mg/dL — ABNORMAL HIGH (ref 65–99)
Potassium: 3 mmol/L — ABNORMAL LOW (ref 3.5–5.1)
Sodium: 134 mmol/L — ABNORMAL LOW (ref 135–145)
TOTAL PROTEIN: 7.1 g/dL (ref 6.5–8.1)
Total Bilirubin: 1 mg/dL (ref 0.3–1.2)

## 2014-09-12 LAB — URINALYSIS COMPLETE WITH MICROSCOPIC (ARMC ONLY)
Bilirubin Urine: NEGATIVE
GLUCOSE, UA: NEGATIVE mg/dL
KETONES UR: NEGATIVE mg/dL
Nitrite: POSITIVE — AB
PROTEIN: NEGATIVE mg/dL
Specific Gravity, Urine: 1.014 (ref 1.005–1.030)
pH: 6 (ref 5.0–8.0)

## 2014-09-12 LAB — MAGNESIUM: Magnesium: 1.9 mg/dL (ref 1.7–2.4)

## 2014-09-12 LAB — PROTIME-INR
INR: 1.33
Prothrombin Time: 16.7 seconds — ABNORMAL HIGH (ref 11.4–15.0)

## 2014-09-12 LAB — TROPONIN I
Troponin I: 0.04 ng/mL — ABNORMAL HIGH (ref <0.031)
Troponin I: 0.03 ng/mL (ref <0.031)

## 2014-09-12 MED ORDER — CEPHALEXIN 250 MG/5ML PO SUSR: 500.0000 mg | Freq: Four times a day (QID) | ORAL | Status: AC

## 2014-09-12 MED ORDER — DEXTROSE 5 % IV SOLN
1.0000 g | INTRAVENOUS | Status: AC
  Administered 2014-09-12: 1 g via INTRAVENOUS
  Filled 2014-09-12: qty 10

## 2014-09-12 NOTE — ED Notes (Addendum)
Per EMS report, patient c/o shortness of breath after seeing his neurologist for a follow-up appointment s/p CVA 3 weeks ago. Patient states follow-up appointment went well, but he felt short of breath after he came home around 1300. Patient states shortness of breath was worse with activity. Patient was placed on O2 in the ambulance for comfort, sats were in the high 90's. Per son's report, patient was at [facility] for two weeks and came home 8/5.

## 2014-09-12 NOTE — ED Provider Notes (Signed)
[facility] Emergency Department Provider Note  ____________________________________________  Time seen: Approximately 4:21 PM  I have reviewed the triage vital signs and the nursing notes.   HISTORY  Chief Complaint Shortness of Breath    HPI Randy Chambers is a 81 y.o. male with multiple chronic medical issues who presents with increased dyspnea starting today.  He was at a doctor's appointment as a follow up for a CVA that he had 3 weeks ago.  After the appointment he felt increasingly short of breath.  It is worse with exertion/activity.  His oxygen saturations remained in the upper 90s.  As long as he is lying down he feels "okay".  The dyspnea is severe at its worst and mild at rest.  The onset was acute and gradually worsening with exertion.  His son reports that he sees Dr. Meredeth Ide the pulmonologist for fluid in the right lung.  This is been a chronic problem for weeks to months.  They have not yet needed to perform a thoracentesis.  He has not had any fever or chills recently.  He denies chest pain and abdominal pain.   Past Medical History  Diagnosis Date  . GERD (gastroesophageal reflux disease)   . Hypertension   . Dysrhythmia   . CHF (congestive heart failure)   . Shortness of breath dyspnea   . Mitral valve disorder   . Angina pectoris   . Stroke     ischemic right  . Coronary artery disease   . Atrial fibrillation     There are no active problems to display for this patient.   Past Surgical History  Procedure Laterality Date  . Tonsillectomy    . Cholecystectomy    . Eye surgery Bilateral     cataracts  . Prostate N/A   . Esophagogastroduodenoscopy N/A 06/08/2014    Procedure: ESOPHAGOGASTRODUODENOSCOPY (EGD);  Surgeon: Scot Jun, MD;  Location: [facility] ENDOSCOPY;  Service: Endoscopy;  Laterality: N/A;    Current Outpatient Rx  Name  Route  Sig  Dispense  Refill  . aspirin EC 81 MG tablet   Oral   Take 81 mg by mouth  daily.         . carvedilol (COREG) 3.125 MG tablet   Oral   Take 3.125 mg by mouth 2 (two) times daily with a meal.         .           . esomeprazole (NEXIUM) 20 MG capsule   Oral   Take 20 mg by mouth daily at 12 noon.         . furosemide (LASIX) 40 MG tablet   Oral   Take 40 mg by mouth daily.           Allergies Review of patient's allergies indicates no known allergies.  No family history on file.  Social History History  Substance Use Topics  . Smoking status: Never Smoker   . Smokeless tobacco: Not on file  . Alcohol Use: Not on file    Review of Systems Constitutional: No fever/chills Eyes: No visual changes. ENT: No sore throat. Cardiovascular: Denies chest pain. Respiratory: Increasing shortness of breath with exertion, worse than baseline Gastrointestinal: No abdominal pain.  No nausea, no vomiting.  No diarrhea.  No constipation. Genitourinary: Negative for dysuria. Musculoskeletal: Negative for back pain. Skin: Negative for rash. Neurological: Negative for headaches, focal weakness or numbness.  10-point ROS otherwise negative.  ____________________________________________   PHYSICAL EXAM:  VITAL SIGNS: ED Triage Vitals  Enc Vitals Group     BP 09/12/14 1604 146/73 mmHg     Pulse Rate 09/12/14 1604 57     Resp --      Temp 09/12/14 1604 97.7 F (36.5 C)     Temp Source 09/12/14 1604 Oral     SpO2 --      Weight 09/12/14 1606 158 lb (71.668 kg)     Height 09/12/14 1606 5\' 11"  (1.803 m)     Head Cir --      [place] --      Pain Score --      Pain Loc --      Pain Edu? --      Excl. in GC? --     Constitutional: Alert and at neurological/psychiatric baseline according to his son who is at bedside.  Elderly male but appears younger than her chronological age.. Eyes: Conjunctivae are normal. PERRL. EOMI. Head: Atraumatic. Nose: No congestion/rhinnorhea. Mouth/Throat: Mucous membranes are moist.  Oropharynx  non-erythematous. Neck: No stridor.   Cardiovascular: Normal rate, regular rhythm. Grossly normal heart sounds.  Good peripheral circulation. Respiratory: Coarse breath sounds throughout the right side which is the lung that they have been told he has chronic fluid.  No retractions.  No wheezing. Gastrointestinal: Soft and nontender. No distention. No abdominal bruits. No CVA tenderness. Musculoskeletal: No lower extremity tenderness nor edema.  No joint effusions. Neurologic:  Normal speech and language. No gross focal neurologic deficits are appreciated.  Skin:  Skin is warm, dry and intact. No rash noted.   ____________________________________________   LABS (all labs ordered are listed, but only abnormal results are displayed)  Labs Reviewed  COMPREHENSIVE METABOLIC PANEL - Abnormal; Notable for the following:    Sodium 134 (*)    Potassium 3.0 (*)    Chloride 96 (*)    Glucose, Bld 118 (*)    BUN 22 (*)    Creatinine, Ser 1.26 (*)    Calcium 8.2 (*)    Albumin 2.9 (*)    ALT 15 (*)    GFR calc non Af Amer 46 (*)    GFR calc Af Amer 54 (*)    All other components within normal limits  PROTIME-INR - Abnormal; Notable for the following:    Prothrombin Time 16.7 (*)    All other components within normal limits  TROPONIN I - Abnormal; Notable for the following:    Troponin I 0.04 (*)    All other components within normal limits  CBC WITH DIFFERENTIAL/PLATELET - Abnormal; Notable for the following:    WBC 11.5 (*)    RBC 4.16 (*)    RDW 15.3 (*)    Neutro Abs 9.1 (*)    All other components within normal limits  URINALYSIS COMPLETEWITH MICROSCOPIC ([facility] ONLY) - Abnormal; Notable for the following:    Color, Urine AMBER (*)    APPearance CLOUDY (*)    Hgb urine dipstick 1+ (*)    Nitrite POSITIVE (*)    Leukocytes, UA 3+ (*)    Bacteria, UA MANY (*)    Squamous Epithelial / LPF 0-5 (*)    All other components within normal limits  URINE CULTURE  MAGNESIUM  TROPONIN  I   ____________________________________________  EKG  ED ECG REPORT I, Shamikia Linskey, the attending physician, personally viewed and interpreted this ECG.   Date: 09/12/2014  EKG Time: 16:21  Rate: 61  Rhythm: Accelerated junctional rhythm versus normal sinus rhythm  Axis: normal  Intervals:Unable to appreciate P waves  ST&T Change: Non-specific ST segment / T-wave changes, but no evidence of acute ischemia.   ____________________________________________  RADIOLOGY  I, Landon Truax, personally viewed and evaluated these images as part of my medical decision making.   Dg Chest 2 View  09/12/2014   CLINICAL DATA:  Shortness of breath today. History of CVA 3 weeks ago.  EXAM: CHEST  2 VIEW  COMPARISON:  Chest x-ray 04/18/2012  FINDINGS: The heart is mildly enlarged but stable. The mediastinal and hilar contours are within normal limits. Mild tortuosity and calcification of the thoracic aorta. There are small bilateral pleural effusions but no pulmonary edema. There are bibasilar scarring changes or possible atelectasis.  IMPRESSION: Stable cardiac enlargement.  Small pleural effusions but no pulmonary edema.  Bibasilar scarring and/or atelectasis.   Electronically Signed   By: Rudie Meyer M.D.   On: 09/12/2014 17:23    ____________________________________________   PROCEDURES  Procedure(s) performed: None  Critical Care performed: No ____________________________________________   INITIAL IMPRESSION / ASSESSMENT AND PLAN / ED COURSE  Pertinent labs & imaging results that were available during my care of the patient were reviewed by me and considered in my medical decision making (see chart for details).  The patient has chronic lung disease and I will evaluate him for a new infectious process as well versus CHF exacerbation versus ACS.  His initial troponin was 0.04.  ----------------------------------------- 8:39 PM on  09/12/2014 -----------------------------------------  +UA (grossly infected).  Awaiting repeat troponin.  Discussed UTI with patient's family.  Offered admission, but the patient's family and I both agree that keeping the patient out of the hospital at his age is preferable if possible.  I will give him a dose of ceftriaxone 1 g IV and a prescription for Keflex as an outpatient.  If his second troponin is essentially the same, I will discharge him for outpatient follow-up.   (Note that documentation was delayed due to multiple ED patients requiring immediate care.)  The patient's second troponin will was lower at 0.03.  I again discussed the plan with his son and he wants to take the patient home and does not want to be admitted at this time.  I feel that this is appropriate given his age, comorbidities, and treatable condition.  I gave them strict return precautions.  ____________________________________________  FINAL CLINICAL IMPRESSION(S) / ED DIAGNOSES  Final diagnoses:  Complicated UTI (urinary tract infection)  Dyspnea      NEW MEDICATIONS STARTED DURING THIS VISIT:  Discharge Medication List as of 09/12/2014  9:11 PM    START taking these medications   Details  cephALEXin (KEFLEX) 250 MG/5ML suspension Take 10 mLs (500 mg total) by mouth 4 (four) times daily., Starting 09/13/2014, Until Sun 09/24/14, Print         Loleta Rose, MD 09/12/14 2221

## 2014-09-12 NOTE — Discharge Instructions (Signed)
As we discussed, Mr. Strubel workup today was reassuring except for a significant urinary tract infection.  His chest x-ray and EKG were reassuring.  His lab work was reassuring as well.  We gave him a dose of ceftriaxone 1 g IV to start his antibiotics treatment.  Please start his prescription for antibiotics tomorrow and take the full course as recommended.  Follow-up with his regular doctor within several days for a follow-up visit.  As we also discussed, given his age and other medical issues, I completely understand and agree with the preference to take him home rather than admit him to the hospital.  However, if he gets at all worse or develops new symptoms that concern you, please return immediately to the emergency department.   Urinary Tract Infection Urinary tract infections (UTIs) can develop anywhere along your urinary tract. Your urinary tract is your body's drainage system for removing wastes and extra water. Your urinary tract includes two kidneys, two ureters, a bladder, and a urethra. Your kidneys are a pair of bean-shaped organs. Each kidney is about the size of your fist. They are located below your ribs, one on each side of your spine. CAUSES Infections are caused by microbes, which are microscopic organisms, including fungi, viruses, and bacteria. These organisms are so small that they can only be seen through a microscope. Bacteria are the microbes that most commonly cause UTIs. SYMPTOMS  Symptoms of UTIs may vary by age and gender of the patient and by the location of the infection. Symptoms in young women typically include a frequent and intense urge to urinate and a painful, burning feeling in the bladder or urethra during urination. Older women and men are more likely to be tired, shaky, and weak and have muscle aches and abdominal pain. A fever may mean the infection is in your kidneys. Other symptoms of a kidney infection include pain in your back or sides below the ribs,  nausea, and vomiting. DIAGNOSIS To diagnose a UTI, your caregiver will ask you about your symptoms. Your caregiver also will ask to provide a urine sample. The urine sample will be tested for bacteria and white blood cells. White blood cells are made by your body to help fight infection. TREATMENT  Typically, UTIs can be treated with medication. Because most UTIs are caused by a bacterial infection, they usually can be treated with the use of antibiotics. The choice of antibiotic and length of treatment depend on your symptoms and the type of bacteria causing your infection. HOME CARE INSTRUCTIONS  If you were prescribed antibiotics, take them exactly as your caregiver instructs you. Finish the medication even if you feel better after you have only taken some of the medication.  Drink enough water and fluids to keep your urine clear or pale yellow.  Avoid caffeine, tea, and carbonated beverages. They tend to irritate your bladder.  Empty your bladder often. Avoid holding urine for long periods of time.  Empty your bladder before and after sexual intercourse.  After a bowel movement, women should cleanse from front to back. Use each tissue only once. SEEK MEDICAL CARE IF:   You have back pain.  You develop a fever.  Your symptoms do not begin to resolve within 3 days. SEEK IMMEDIATE MEDICAL CARE IF:   You have severe back pain or lower abdominal pain.  You develop chills.  You have nausea or vomiting.  You have continued burning or discomfort with urination. MAKE SURE YOU:   Understand these instructions.  Will watch your condition.  Will get help right away if you are not doing well or get worse. Document Released: 10/30/2004 Document Revised: 07/22/2011 Document Reviewed: 02/28/2011 [facility] Patient Information 2015 [place], [place]. This information is not intended to replace advice given to you by your health care provider. Make sure you discuss any questions you have with  your health care provider.  Shortness of Breath Shortness of breath means you have trouble breathing. Shortness of breath needs medical care right away. HOME CARE   Do not smoke.  Avoid being around chemicals or things (paint fumes, dust) that may bother your breathing.  Rest as needed. Slowly begin your normal activities.  Only take medicines as told by your doctor.  Keep all doctor visits as told. GET HELP RIGHT AWAY IF:   Your shortness of breath gets worse.  You feel lightheaded, pass out (faint), or have a cough that is not helped by medicine.  You cough up blood.  You have pain with breathing.  You have pain in your chest, arms, shoulders, or belly (abdomen).  You have a fever.  You cannot walk up stairs or exercise the way you normally do.  You do not get better in the time expected.  You have a hard time doing normal activities even with rest.  You have problems with your medicines.  You have any new symptoms. MAKE SURE YOU:  Understand these instructions.  Will watch your condition.  Will get help right away if you are not doing well or get worse. Document Released: 07/09/2007 Document Revised: 01/25/2013 Document Reviewed: 04/07/2011 [facility] Patient Information 2015 [place], [place]. This information is not intended to replace advice given to you by your health care provider. Make sure you discuss any questions you have with your health care provider.

## 2014-09-16 LAB — URINE CULTURE
Culture: >=100000
SPECIAL REQUESTS: NORMAL

## 2014-09-21 ENCOUNTER — Other Ambulatory Visit: Payer: Self-pay

## 2014-09-21 ENCOUNTER — Inpatient Hospital Stay
Admission: EM | Admit: 2014-09-21 | Discharge: 2014-10-05 | DRG: 291 | Disposition: E | Payer: Medicare Other | Attending: Internal Medicine | Admitting: Internal Medicine

## 2014-09-21 ENCOUNTER — Encounter: Payer: Self-pay | Admitting: Emergency Medicine

## 2014-09-21 ENCOUNTER — Emergency Department: Payer: Medicare Other

## 2014-09-21 DIAGNOSIS — K219 Gastro-esophageal reflux disease without esophagitis: Secondary | ICD-10-CM | POA: Diagnosis present

## 2014-09-21 DIAGNOSIS — Z825 Family history of asthma and other chronic lower respiratory diseases: Secondary | ICD-10-CM

## 2014-09-21 DIAGNOSIS — I959 Hypotension, unspecified: Secondary | ICD-10-CM | POA: Diagnosis not present

## 2014-09-21 DIAGNOSIS — R195 Other fecal abnormalities: Secondary | ICD-10-CM | POA: Diagnosis present

## 2014-09-21 DIAGNOSIS — Z66 Do not resuscitate: Secondary | ICD-10-CM | POA: Diagnosis not present

## 2014-09-21 DIAGNOSIS — J441 Chronic obstructive pulmonary disease with (acute) exacerbation: Secondary | ICD-10-CM | POA: Diagnosis present

## 2014-09-21 DIAGNOSIS — R0602 Shortness of breath: Secondary | ICD-10-CM | POA: Diagnosis present

## 2014-09-21 DIAGNOSIS — N183 Chronic kidney disease, stage 3 (moderate): Secondary | ICD-10-CM | POA: Diagnosis present

## 2014-09-21 DIAGNOSIS — F0390 Unspecified dementia without behavioral disturbance: Secondary | ICD-10-CM | POA: Diagnosis present

## 2014-09-21 DIAGNOSIS — I480 Paroxysmal atrial fibrillation: Secondary | ICD-10-CM | POA: Diagnosis present

## 2014-09-21 DIAGNOSIS — I2510 Atherosclerotic heart disease of native coronary artery without angina pectoris: Secondary | ICD-10-CM | POA: Diagnosis present

## 2014-09-21 DIAGNOSIS — Z87440 Personal history of urinary (tract) infections: Secondary | ICD-10-CM | POA: Diagnosis not present

## 2014-09-21 DIAGNOSIS — J9601 Acute respiratory failure with hypoxia: Secondary | ICD-10-CM | POA: Diagnosis present

## 2014-09-21 DIAGNOSIS — I69354 Hemiplegia and hemiparesis following cerebral infarction affecting left non-dominant side: Secondary | ICD-10-CM

## 2014-09-21 DIAGNOSIS — Z7982 Long term (current) use of aspirin: Secondary | ICD-10-CM

## 2014-09-21 DIAGNOSIS — I5033 Acute on chronic diastolic (congestive) heart failure: Principal | ICD-10-CM | POA: Diagnosis present

## 2014-09-21 DIAGNOSIS — J90 Pleural effusion, not elsewhere classified: Secondary | ICD-10-CM | POA: Diagnosis present

## 2014-09-21 DIAGNOSIS — I509 Heart failure, unspecified: Secondary | ICD-10-CM

## 2014-09-21 DIAGNOSIS — I129 Hypertensive chronic kidney disease with stage 1 through stage 4 chronic kidney disease, or unspecified chronic kidney disease: Secondary | ICD-10-CM | POA: Diagnosis present

## 2014-09-21 LAB — CBC WITH DIFFERENTIAL/PLATELET
BASOS PCT: 1 %
Basophils Absolute: 0.1 10*3/uL (ref 0–0.1)
EOS ABS: 0.2 10*3/uL (ref 0–0.7)
EOS PCT: 2 %
HCT: 40 % (ref 40.0–52.0)
HEMOGLOBIN: 13.5 g/dL (ref 13.0–18.0)
Lymphocytes Relative: 15 %
Lymphs Abs: 1.6 10*3/uL (ref 1.0–3.6)
MCH: 33.6 pg (ref 26.0–34.0)
MCHC: 33.8 g/dL (ref 32.0–36.0)
MCV: 99.2 fL (ref 80.0–100.0)
MONOS PCT: 8 %
Monocytes Absolute: 0.9 10*3/uL (ref 0.2–1.0)
NEUTROS PCT: 74 %
Neutro Abs: 8.1 10*3/uL — ABNORMAL HIGH (ref 1.4–6.5)
PLATELETS: 208 10*3/uL (ref 150–440)
RBC: 4.03 MIL/uL — ABNORMAL LOW (ref 4.40–5.90)
RDW: 14.7 % — AB (ref 11.5–14.5)
WBC: 10.8 10*3/uL — ABNORMAL HIGH (ref 3.8–10.6)

## 2014-09-21 LAB — COMPREHENSIVE METABOLIC PANEL
ALBUMIN: 2.8 g/dL — AB (ref 3.5–5.0)
ALK PHOS: 60 U/L (ref 38–126)
ALT: 20 U/L (ref 17–63)
ANION GAP: 11 (ref 5–15)
AST: 46 U/L — ABNORMAL HIGH (ref 15–41)
BUN: 50 mg/dL — ABNORMAL HIGH (ref 6–20)
CHLORIDE: 102 mmol/L (ref 101–111)
CO2: 26 mmol/L (ref 22–32)
Calcium: 8.6 mg/dL — ABNORMAL LOW (ref 8.9–10.3)
Creatinine, Ser: 1.53 mg/dL — ABNORMAL HIGH (ref 0.61–1.24)
GFR calc non Af Amer: 37 mL/min — ABNORMAL LOW (ref >60)
GFR, EST AFRICAN AMERICAN: 42 mL/min — AB (ref >60)
GLUCOSE: 151 mg/dL — AB (ref 65–99)
POTASSIUM: 3.9 mmol/L (ref 3.5–5.1)
SODIUM: 139 mmol/L (ref 135–145)
Total Bilirubin: 0.7 mg/dL (ref 0.3–1.2)
Total Protein: 6.9 g/dL (ref 6.5–8.1)

## 2014-09-21 LAB — TROPONIN I
Troponin I: 0.04 ng/mL — ABNORMAL HIGH (ref <0.031)
Troponin I: 0.04 ng/mL — ABNORMAL HIGH (ref <0.031)

## 2014-09-21 LAB — BRAIN NATRIURETIC PEPTIDE: B Natriuretic Peptide: 178 pg/mL — ABNORMAL HIGH (ref 0.0–100.0)

## 2014-09-21 MED ORDER — BISACODYL 5 MG PO TBEC
5.0000 mg | DELAYED_RELEASE_TABLET | Freq: Every day | ORAL | Status: DC | PRN
  Filled 2014-09-21: qty 1

## 2014-09-21 MED ORDER — MORPHINE SULFATE (CONCENTRATE) 10 MG/0.5ML PO SOLN: 5.0000 mg | ORAL | Status: DC | PRN

## 2014-09-21 MED ORDER — ALBUTEROL SULFATE (2.5 MG/3ML) 0.083% IN NEBU: 2.5000 mg | INHALATION_SOLUTION | RESPIRATORY_TRACT | Status: DC | PRN

## 2014-09-21 MED ORDER — ACETAMINOPHEN 325 MG PO TABS: 650.0000 mg | ORAL_TABLET | Freq: Four times a day (QID) | ORAL | Status: DC | PRN

## 2014-09-21 MED ORDER — SODIUM CHLORIDE 0.9 % IV SOLN: 250.0000 mL | INTRAVENOUS | Status: DC | PRN

## 2014-09-21 MED ORDER — ASPIRIN 81 MG PO CHEW
324.0000 mg | CHEWABLE_TABLET | Freq: Once | ORAL | Status: AC
  Administered 2014-09-21: 324 mg via ORAL
  Filled 2014-09-21: qty 4

## 2014-09-21 MED ORDER — SODIUM CHLORIDE 0.9 % IV SOLN
1.5000 g | Freq: Two times a day (BID) | INTRAVENOUS | Status: DC
  Filled 2014-09-21 (×2): qty 1.5

## 2014-09-21 MED ORDER — NOREPINEPHRINE BITARTRATE 1 MG/ML IV SOLN: 0.0000 ug/min | INTRAVENOUS | Status: DC

## 2014-09-21 MED ORDER — ACETAMINOPHEN 650 MG RE SUPP: 650.0000 mg | Freq: Four times a day (QID) | RECTAL | Status: DC | PRN

## 2014-09-21 MED ORDER — SODIUM CHLORIDE 0.9 % IJ SOLN: 3.0000 mL | INTRAMUSCULAR | Status: DC | PRN

## 2014-09-21 MED ORDER — PANTOPRAZOLE SODIUM 40 MG PO TBEC: 40.0000 mg | DELAYED_RELEASE_TABLET | Freq: Every day | ORAL | Status: DC

## 2014-09-21 MED ORDER — ASPIRIN EC 81 MG PO TBEC: 81.0000 mg | DELAYED_RELEASE_TABLET | Freq: Every day | ORAL | Status: DC

## 2014-09-21 MED ORDER — FUROSEMIDE 10 MG/ML IJ SOLN
40.0000 mg | Freq: Once | INTRAMUSCULAR | Status: AC
  Administered 2014-09-21: 40 mg via INTRAVENOUS
  Filled 2014-09-21: qty 4

## 2014-09-21 MED ORDER — SODIUM CHLORIDE 0.9 % IJ SOLN: 3.0000 mL | Freq: Two times a day (BID) | INTRAMUSCULAR | Status: DC

## 2014-09-21 MED ORDER — SODIUM CHLORIDE 0.9 % IV BOLUS (SEPSIS): 500.0000 mL | Freq: Once | INTRAVENOUS | Status: DC

## 2014-09-21 MED ORDER — LORAZEPAM 2 MG/ML IJ SOLN: 1.0000 mg | INTRAMUSCULAR | Status: DC | PRN

## 2014-09-21 MED ORDER — APIXABAN 2.5 MG PO TABS: 2.5000 mg | ORAL_TABLET | Freq: Two times a day (BID) | ORAL | Status: DC

## 2014-09-21 MED ORDER — LORAZEPAM 2 MG/ML PO CONC: 1.0000 mg | ORAL | Status: DC | PRN

## 2014-09-21 MED ORDER — HYDRALAZINE HCL 20 MG/ML IJ SOLN: 10.0000 mg | Freq: Four times a day (QID) | INTRAMUSCULAR | Status: DC | PRN

## 2014-09-21 MED ORDER — MORPHINE SULFATE (PF) 2 MG/ML IV SOLN: 1.0000 mg | INTRAVENOUS | Status: DC | PRN

## 2014-09-21 MED ORDER — FUROSEMIDE 10 MG/ML IJ SOLN: 40.0000 mg | Freq: Two times a day (BID) | INTRAMUSCULAR | Status: DC

## 2014-09-21 MED ORDER — NOREPINEPHRINE 4 MG/250ML-% IV SOLN
0.0000 ug/min | INTRAVENOUS | Status: DC
  Filled 2014-09-21: qty 250

## 2014-09-21 MED ORDER — CARVEDILOL 3.125 MG PO TABS: 3.1250 mg | ORAL_TABLET | Freq: Two times a day (BID) | ORAL | Status: DC

## 2014-09-21 MED ORDER — ONDANSETRON HCL 4 MG/2ML IJ SOLN
4.0000 mg | Freq: Four times a day (QID) | INTRAMUSCULAR | Status: DC | PRN
  Administered 2014-09-21: 4 mg via INTRAVENOUS
  Filled 2014-09-21: qty 2

## 2014-09-21 MED ORDER — ONDANSETRON HCL 4 MG PO TABS: 4.0000 mg | ORAL_TABLET | Freq: Four times a day (QID) | ORAL | Status: DC | PRN

## 2014-09-21 MED ORDER — LORAZEPAM 1 MG PO TABS: 1.0000 mg | ORAL_TABLET | ORAL | Status: DC | PRN

## 2014-09-21 MED ORDER — DOCUSATE SODIUM 100 MG PO CAPS: 100.0000 mg | ORAL_CAPSULE | Freq: Two times a day (BID) | ORAL | Status: DC

## 2014-10-05 NOTE — ED Notes (Signed)
Pt's O2 sats kept triggering alarm. Changed oximeter, still low. Increased O2 to 3L

## 2014-10-05 NOTE — Progress Notes (Signed)
   10-09-14 2000  Clinical Encounter Type  Visited With Patient and family together  Visit Type Death  Referral From Nurse  Consult/Referral To Chaplain  Spiritual Encounters  Spiritual Needs Ritual;Prayer;[facility] text;Emotional;Grief support  Stress Factors  Family Stress Factors Family relationships;Loss  Met w/patient & family. Provided last rites at family's request, comfort & spiritual care to patient & family and prayer.

## 2014-10-05 NOTE — Discharge Summary (Signed)
  Date of Admission: 09/22/2014 11:04 AM  Date of death: September 22, 2014   Admitting diagnosis:  Acute respiratory failure Acute chf copd     Diagnosis at time of death* 1.acute respiratory failure due to chf 2.hypotention 3.cute copd falure      Hospital course  Randy Chambers is a 81 y.o. male with a known history of chronic diastolic CHF, atrial fibrillation, hypertension, chronic dyspnea presents to the emergency room with worsening shortness of breath. Patient had saturations in the low 80s on room air. He mentions that he is chronically short of breath but has been feeling worse over the past week. Pt was admited to telemetry floor on floor he had episode of blank stare, noted to have hypotention. I went to discuss with family regardring condition and made him dnr. Plan to trasnfer to icu but family wanted comfort care , shortly after he passed away.           TOTAL TIME TAKING CARE OF THIS PATIENT: 35 minutes.    Auburn Bilberry M.D on 2014/09/22 at 8:41 PM  Between 7am to 6pm - Pager - [phone]  After 6pm go to [email or web address] - password [email or web address] [facility]  [facility]  Office  [phone]  CC: Primary care physician; Lynnae Prude, MD

## 2014-10-05 NOTE — H&P (Signed)
[facility] [place] at [facility]   PATIENT NAME: Randy Chambers    MR#:  [id number]  DATE OF BIRTH:  1916/03/06  DATE OF ADMISSION:  10/10/2014  PRIMARY CARE PHYSICIAN: Lynnae Prude, MD   REQUESTING/REFERRING PHYSICIAN: Dr. Inocencio Homes  CHIEF COMPLAINT:   Chief Complaint  Patient presents with  . Shortness of Breath    HISTORY OF PRESENT ILLNESS:  Randy Chambers  is a 81 y.o. male with a known history of chronic diastolic CHF, atrial fibrillation, hypertension, chronic dyspnea presents to the emergency room with worsening shortness of breath. Patient had saturations in the low 80s on room air. He mentions that he is chronically short of breath but has been feeling worse over the past week. He was seen in the emergency room for weakness week prior and was started on Ceftin for urinary tract infection. He never recovered from the weakness. And with worsening shortness of breath is here in the emergency room today. He does have orthopnea but no edema. No chest pain. Chest x-ray shows bilateral small pleural effusions. Being admitted for CHF. Along with acute respiratory failure. Does not use home oxygen.  PAST MEDICAL HISTORY:   Past Medical History  Diagnosis Date  . GERD (gastroesophageal reflux disease)   . Hypertension   . Dysrhythmia   . CHF (congestive heart failure)   . Shortness of breath dyspnea   . Mitral valve disorder   . Angina pectoris   . Stroke     ischemic right  . Coronary artery disease   . Atrial fibrillation     PAST SURGICAL HISTORY:   Past Surgical History  Procedure Laterality Date  . Tonsillectomy    . Cholecystectomy    . Eye surgery Bilateral     cataracts  . Prostate N/A   . Esophagogastroduodenoscopy N/A 06/08/2014    Procedure: ESOPHAGOGASTRODUODENOSCOPY (EGD);  Surgeon: Scot Jun, MD;  Location: [facility] ENDOSCOPY;  Service: Endoscopy;  Laterality: N/A;    SOCIAL HISTORY:   Social History  Substance Use Topics   . Smoking status: Never Smoker   . Smokeless tobacco: Not on file  . Alcohol Use: Not on file    FAMILY HISTORY:   Family History  Problem Relation Age of Onset  . COPD Other     DRUG ALLERGIES:  No Known Allergies  REVIEW OF SYSTEMS:   Review of Systems  Constitutional: Negative for fever, chills and weight loss.  HENT: Positive for hearing loss. Negative for nosebleeds.   Eyes: Negative for blurred vision, double vision and pain.  Respiratory: Positive for cough and shortness of breath. Negative for hemoptysis, sputum production and wheezing.   Cardiovascular: Positive for orthopnea. Negative for chest pain, palpitations and leg swelling.  Gastrointestinal: Negative for nausea, vomiting, abdominal pain, diarrhea and constipation.  Genitourinary: Negative for dysuria and hematuria.  Musculoskeletal: Negative for myalgias, back pain and falls.  Skin: Negative for rash.  Neurological: Positive for weakness. Negative for dizziness, tremors, sensory change, speech change, focal weakness, seizures and headaches.  Endo/Heme/Allergies: Does not bruise/bleed easily.  Psychiatric/Behavioral: Negative for depression and memory loss. The patient is not nervous/anxious.     MEDICATIONS AT HOME:   Prior to Admission medications   Medication Sig Start Date End Date Taking? Authorizing Provider  aspirin EC 81 MG tablet Take 81 mg by mouth daily.    Historical Provider, MD  carvedilol (COREG) 3.125 MG tablet Take 3.125 mg by mouth 2 (two) times daily with a meal.  Historical Provider, MD  cephALEXin (KEFLEX) 250 MG/5ML suspension Take 10 mLs (500 mg total) by mouth 4 (four) times daily. 09/13/14 09/24/14  Loleta Rose, MD  esomeprazole (NEXIUM) 20 MG capsule Take 20 mg by mouth daily at 12 noon.    Historical Provider, MD  furosemide (LASIX) 40 MG tablet Take 40 mg by mouth daily.    Historical Provider, MD      VITAL SIGNS:  Blood pressure 183/164, pulse 75, temperature 97.5 F (36.4  C), temperature source Axillary, resp. rate 16, height 6' (1.829 m), weight 64.1 kg (141 lb 5 oz), SpO2 96 %.  PHYSICAL EXAMINATION:  Physical Exam  GENERAL:  81 y.o.-year-old patient lying in the bed with no acute distress.  EYES: Pupils equal, round, reactive to light and accommodation. No scleral icterus. Extraocular muscles intact.  HEENT: Head atraumatic, normocephalic. Oropharynx and nasopharynx clear. No oropharyngeal erythema, moist oral mucosa  NECK:  Supple, no jugular venous distention. No thyroid enlargement, no tenderness.  LUNGS: Decreased breath sounds at the bases bilaterally. CARDIOVASCULAR: S1, S2 normal. No murmurs, rubs, or gallops.  ABDOMEN: Soft, nontender, nondistended. Bowel sounds present. No organomegaly or mass.  EXTREMITIES: No pedal edema, cyanosis, or clubbing. + 2 pedal & radial pulses b/l.   NEUROLOGIC: Cranial nerves II through XII are intact. No focal Motor or sensory deficits appreciated b/l PSYCHIATRIC: The patient is alert and oriented x 3. Good affect.  SKIN: No obvious rash, lesion, or ulcer.   LABORATORY PANEL:   CBC  Recent Labs Lab 27-Sep-2014 1139  WBC 10.8*  HGB 13.5  HCT 40.0  PLT 208   ------------------------------------------------------------------------------------------------------------------  Chemistries   Recent Labs Lab 09/27/14 1139  NA 139  K 3.9  CL 102  CO2 26  GLUCOSE 151*  BUN 50*  CREATININE 1.53*  CALCIUM 8.6*  AST 46*  ALT 20  ALKPHOS 60  BILITOT 0.7   ------------------------------------------------------------------------------------------------------------------  Cardiac Enzymes  Recent Labs Lab 09-27-14 1139  TROPONINI 0.04*   ------------------------------------------------------------------------------------------------------------------  RADIOLOGY:  Dg Chest 2 View  09-27-14   CLINICAL DATA:  Shortness of breath today.  EXAM: CHEST  2 VIEW  COMPARISON:  PA and lateral chest 09/12/2014  and 06/02/2011.  FINDINGS: Small bilateral pleural effusions do not appear changed compared to the most recent exam. There is associated mild basilar atelectasis. Heart size is upper normal. No pulmonary edema is seen. No pneumothorax is identified.  IMPRESSION: No change in small bilateral pleural effusions and basilar atelectasis since the most recent exam. No new abnormality.   Electronically Signed   By: Drusilla Kanner M.D.   On: 09/27/14 12:06     IMPRESSION AND PLAN:   * Acute on Chronic Diastolic Congestive Heart Failure - IV Lasix, Beta blockers - Input and Output - Counseled to limit fluids and Salt - Monitor Bun/Cr and Potassium -Cardiology follow up after discharge  * Acute respiratory failure Due to CHF. Wean oxygen for sats greater than 90%.  * Paroxysmal atrial fibrillation Home medications and Eliquis.  * CKD3 Stable Monitor for worsening as patient will be on IV Lasix.  * History of CVA   Nothing acute  * DVT prophylaxis. Patient is on Eliquis.    All the records are reviewed and case discussed with ED provider. Management plans discussed with the patient, family and they are in agreement.  CODE STATUS: FULL CODE  TOTAL TIME TAKING CARE OF THIS PATIENT: 40 minutes.    Milagros Loll R M.D on September 27, 2014 at 1:34 PM  Between 7am to 6pm - Pager - [phone]  After 6pm go to [email or web address] - password [email or web address] [facility]  [employer] [facility]  Office  [phone]  CC: Primary care physician; Lynnae Prude, MD

## 2014-10-05 NOTE — Progress Notes (Signed)
ANTIBIOTIC CONSULT NOTE - INITIAL  Pharmacy Consult for Unasyn Indication: rule out pneumonia  No Known Allergies  Patient Measurements: Height: 6' (182.9 cm) Weight: 136 lb (61.689 kg) IBW/kg (Calculated) : 77.6   Vital Signs: Temp: 97.5 F (36.4 C) (08/18 1121) Temp Source: Axillary (08/18 1121) BP: 76/51 mmHg (08/18 1913) Pulse Rate: 80 (08/18 1654) Intake/Output from previous day:   Intake/Output from this shift:    Labs:  Recent Labs  29-Sep-2014 1139  WBC 10.8*  HGB 13.5  PLT 208  CREATININE 1.53*   Estimated Creatinine Clearance: 24.6 mL/min (by C-G formula based on Cr of 1.53). No results for input(s): VANCOTROUGH, VANCOPEAK, VANCORANDOM, GENTTROUGH, GENTPEAK, GENTRANDOM, TOBRATROUGH, TOBRAPEAK, TOBRARND, AMIKACINPEAK, AMIKACINTROU, AMIKACIN in the last 72 hours.   Microbiology: Recent Results (from the past 720 hour(s))  Urine culture     Status: None   Collection Time: 09/12/14  6:52 PM  Result Value Ref Range Status   Specimen Description URINE, CLEAN CATCH  Final   Special Requests Normal  Final   Culture >=100,000 COLONIES/mL KLEBSIELLA PNEUMONIAE  Final   Report Status 09/16/2014 FINAL  Final   Organism ID, Bacteria KLEBSIELLA PNEUMONIAE  Final      Susceptibility   Klebsiella pneumoniae - MIC*    AMPICILLIN Value in next row Resistant      RESISTANT>=32    CEFAZOLIN Value in next row Sensitive      RESISTANT>=32    CEFTRIAXONE Value in next row Sensitive      RESISTANT>=32    CIPROFLOXACIN Value in next row Sensitive      RESISTANT>=32    GENTAMICIN Value in next row Sensitive      RESISTANT>=32    IMIPENEM Value in next row Sensitive      RESISTANT>=32    NITROFURANTOIN Value in next row Sensitive      RESISTANT>=32    TRIMETH/SULFA Value in next row Sensitive      RESISTANT>=32    PIP/TAZO Value in next row Sensitive      SENSITIVE<=4    LEVOFLOXACIN Value in next row Sensitive      SENSITIVE<=0.12    * >=100,000 COLONIES/mL  KLEBSIELLA PNEUMONIAE    Medical History: Past Medical History  Diagnosis Date  . GERD (gastroesophageal reflux disease)   . Hypertension   . Dysrhythmia   . CHF (congestive heart failure)   . Shortness of breath dyspnea   . Mitral valve disorder   . Angina pectoris   . Stroke     ischemic right  . Coronary artery disease   . Atrial fibrillation     Medications:  Scheduled:  . ampicillin-sulbactam (UNASYN) IV  1.5 g Intravenous Q12H  . [START ON 09/22/2014] carvedilol  3.125 mg Oral BID WC  . docusate sodium  100 mg Oral BID  . pantoprazole  40 mg Oral Daily  . sodium chloride  500 mL Intravenous Once  . sodium chloride  3 mL Intravenous Q12H  . sodium chloride  3 mL Intravenous Q12H   Infusions:   PRN: sodium chloride, acetaminophen **OR** acetaminophen, albuterol, bisacodyl, hydrALAZINE, ondansetron **OR** ondansetron (ZOFRAN) IV, sodium chloride  Assessment: 81 y/o M with possible aspiration PNA.   Plan:  Will order Unasyn 1.5 g q 12 hours and f/u renal function and culture results.   Luisa Hart D September 29, 2014,7:36 PM

## 2014-10-05 NOTE — Progress Notes (Addendum)
[facility] Physicians - [place] at [facility]                                                                                                                                                                                            Patient Demographics   Randy Chambers, is a 81 y.o. male, DOB - October 05, 1916, WUJ:[id number]  Admit date - 09-27-14   Admitting Physician Milagros Loll, MD  Outpatient Primary MD for the patient is Lynnae Prude, MD   LOS - 0  Subjective: called by nurse due to patient got to chair then started having blank stair, bp low      Review of Systems:    unable to provide pt poorly responsive  Vitals:   Filed Vitals:   09-27-2014 1609 Sep 27, 2014 1654 09-27-2014 1900 2014/09/27 1913  BP: 111/78 95/55 78/34  76/51  Pulse: 89 80    Temp:      TempSrc:      Resp: 24 24    Height:      Weight:  61.689 kg (136 lb)    SpO2: 100% 98%      Wt Readings from Last 3 Encounters:  09/27/2014 61.689 kg (136 lb)  09/12/14 71.668 kg (158 lb)  06/08/14 74.844 kg (165 lb)     Intake/Output Summary (Last 24 hours) at 09-27-2014 1918 Last data filed at 09-27-2014 1630  Gross per 24 hour  Intake      0 ml  Output      0 ml  Net      0 ml    Physical Exam:   GENERAL: critically HEAD, EYES, EARS, NOSE AND THROAT: Atraumatic, normocephalic. Extraocular muscles are intact. Pupils equal and reactive to light. Sclerae anicteric. No conjunctival injection. No oro-pharyngeal erythema.  NECK: Supple. There is no jugular venous distention. No bruits, no lymphadenopathy, no thyromegaly.  HEART: Regular rate and rhythm,. No murmurs, no rubs, no clicks.  LUNGS: decreased bp ABDOMEN: Soft, flat, nontender, nondistended. Has good bowel sounds. No hepatosplenomegaly appreciated.  EXTREMITIES: No evidence of any cyanosis, clubbing, or peripheral edema.  +2 pedal and radial pulses bilaterally.  NEUROLOGIC:  Moves extremity not following command SKIN: Moist and warm with no  rashes appreciated.  Psych: poorly responisve LN: No inguinal LN enlargement    Antibiotics   Anti-infectives    None      Medications   Scheduled Meds: . [START ON 09/22/2014] carvedilol  3.125 mg Oral BID WC  . docusate sodium  100 mg Oral BID  . pantoprazole  40 mg Oral Daily  . sodium chloride  500 mL Intravenous Once  .  sodium chloride  3 mL Intravenous Q12H  . sodium chloride  3 mL Intravenous Q12H   Continuous Infusions:  PRN Meds:.sodium chloride, acetaminophen **OR** acetaminophen, albuterol, bisacodyl, hydrALAZINE, ondansetron **OR** ondansetron (ZOFRAN) IV, sodium chloride   Data Review:   Micro Results Recent Results (from the past 240 hour(s))  Urine culture     Status: None   Collection Time: 09/12/14  6:52 PM  Result Value Ref Range Status   Specimen Description URINE, CLEAN CATCH  Final   Special Requests Normal  Final   Culture >=100,000 COLONIES/mL KLEBSIELLA PNEUMONIAE  Final   Report Status 09/16/2014 FINAL  Final   Organism ID, Bacteria KLEBSIELLA PNEUMONIAE  Final      Susceptibility   Klebsiella pneumoniae - MIC*    AMPICILLIN Value in next row Resistant      RESISTANT>=32    CEFAZOLIN Value in next row Sensitive      RESISTANT>=32    CEFTRIAXONE Value in next row Sensitive      RESISTANT>=32    CIPROFLOXACIN Value in next row Sensitive      RESISTANT>=32    GENTAMICIN Value in next row Sensitive      RESISTANT>=32    IMIPENEM Value in next row Sensitive      RESISTANT>=32    NITROFURANTOIN Value in next row Sensitive      RESISTANT>=32    TRIMETH/SULFA Value in next row Sensitive      RESISTANT>=32    PIP/TAZO Value in next row Sensitive      SENSITIVE<=4    LEVOFLOXACIN Value in next row Sensitive      SENSITIVE<=0.12    * >=100,000 COLONIES/mL KLEBSIELLA PNEUMONIAE    Radiology Reports Dg Chest 2 View  Oct 04, 2014   CLINICAL DATA:  Shortness of breath today.  EXAM: CHEST  2 VIEW  COMPARISON:  PA and lateral chest 09/12/2014 and  06/02/2011.  FINDINGS: Small bilateral pleural effusions do not appear changed compared to the most recent exam. There is associated mild basilar atelectasis. Heart size is upper normal. No pulmonary edema is seen. No pneumothorax is identified.  IMPRESSION: No change in small bilateral pleural effusions and basilar atelectasis since the most recent exam. No new abnormality.   Electronically Signed   By: Drusilla Kanner M.D.   On: 2014-10-04 12:06   Dg Chest 2 View  09/12/2014   CLINICAL DATA:  Shortness of breath today. History of CVA 3 weeks ago.  EXAM: CHEST  2 VIEW  COMPARISON:  Chest x-ray 04/18/2012  FINDINGS: The heart is mildly enlarged but stable. The mediastinal and hilar contours are within normal limits. Mild tortuosity and calcification of the thoracic aorta. There are small bilateral pleural effusions but no pulmonary edema. There are bibasilar scarring changes or possible atelectasis.  IMPRESSION: Stable cardiac enlargement.  Small pleural effusions but no pulmonary edema.  Bibasilar scarring and/or atelectasis.   Electronically Signed   By: Rudie Meyer M.D.   On: 09/12/2014 17:23     CBC  Recent Labs Lab 10-04-2014 1139  WBC 10.8*  HGB 13.5  HCT 40.0  PLT 208  MCV 99.2  MCH 33.6  MCHC 33.8  RDW 14.7*  LYMPHSABS 1.6  MONOABS 0.9  EOSABS 0.2  BASOSABS 0.1    Chemistries   Recent Labs Lab 10/04/2014 1139  NA 139  K 3.9  CL 102  CO2 26  GLUCOSE 151*  BUN 50*  CREATININE 1.53*  CALCIUM 8.6*  AST 46*  ALT 20  ALKPHOS 60  BILITOT 0.7   ------------------------------------------------------------------------------------------------------------------ estimated creatinine clearance is 24.6 mL/min (by C-G formula based on Cr of 1.53). ------------------------------------------------------------------------------------------------------------------ No results for input(s): HGBA1C in the last 72  hours. ------------------------------------------------------------------------------------------------------------------ No results for input(s): CHOL, HDL, LDLCALC, TRIG, CHOLHDL, LDLDIRECT in the last 72 hours. ------------------------------------------------------------------------------------------------------------------ No results for input(s): TSH, T4TOTAL, T3FREE, THYROIDAB in the last 72 hours.  Invalid input(s): FREET3 ------------------------------------------------------------------------------------------------------------------ No results for input(s): VITAMINB12, FOLATE, FERRITIN, TIBC, IRON, RETICCTPCT in the last 72 hours.  Coagulation profile No results for input(s): INR, PROTIME in the last 168 hours.  No results for input(s): DDIMER in the last 72 hours.  Cardiac Enzymes  Recent Labs Lab 09/27/14 1139 2014-09-27 1747  TROPONINI 0.04* 0.04*   ------------------------------------------------------------------------------------------------------------------ Invalid input(s): POCBNP    Assessment & Plan   Decreased in responsivenss: suspect due to low bp, give fluid bolus, stop lasix,  Family at bedside, discussed code status he made dnr but still would want him treated to see how he does overnight Ct head if no improvent past fluid bolus Possible aspiration pna iv unasyn     Code Status Orders        Start     Ordered   Sep 27, 2014 1918  Do not attempt resuscitation (DNR)   Continuous    Question Answer Comment  In the event of cardiac or respiratory ARREST Do not call a "code blue"   In the event of cardiac or respiratory ARREST Do not perform Intubation, CPR, defibrillation or ACLS   In the event of cardiac or respiratory ARREST Use medication by any route, position, wound care, and other measures to relive pain and suffering. May use oxygen, suction and manual treatment of airway obstruction as needed for comfort.      27-Sep-2014 1917    Advance  Directive Documentation        Most Recent Value   Type of Advance Directive  Healthcare Power of Attorney   Pre-existing out of facility DNR order (yellow form or pink MOST form)     "MOST" Form in Place?                    Lab Results  Component Value Date   PLT 208 September 27, 2014     Time Spent in minutes   critical care time spent  Auburn Bilberry M.D on September 27, 2014 at 7:18 PM  Between 7am to 6pm - Pager - [phone]  After 6pm go to [email or web address] - password [email or web address] [facility]  [facility]   Office  [phone]

## 2014-10-05 NOTE — ED Notes (Signed)
Pt to ED from home via EMS transport with c/o sob, per pt's on pt had a CVA x 1 month ago and received TPA, also seen in the ED at [facility] last week for SOB and dx with UTI and told he may have some CHF, has been taking cephalaxin but did not take it 2 days this week

## 2014-10-05 NOTE — Progress Notes (Signed)
Family wanted patient up to bedside chair. Patient, from a sitting position, laid back on the bed. He was sat back up and had a blank stare and was not responding. He was placed back in bed and Dr. Allena Katz was called and came to the room.

## 2014-10-05 NOTE — Progress Notes (Signed)
Confirmed patient time of passing with Jamesetta Geralds, RN.  CDS called, pt not eligible for donation.  Dr Auburn Bilberry notified of pt passing.  House Supervisor [place], [place] notified of pt passing.  Family remains at bedside.  Will notify staff when ready for post mortem care. Cristela Felt, RN

## 2014-10-05 NOTE — Progress Notes (Signed)
Patient appears to be actively dying.  MD notified that family wants no aggressive treatment.  Family at bedside, grieving appropriately.  Chaplain at bedside with family at this time.  Cristela Felt, RN

## 2014-10-05 NOTE — ED Notes (Signed)
Dr. Gayle notified of critical trop of 0.04 

## 2014-10-05 NOTE — ED Provider Notes (Signed)
[facility] Emergency Department Provider Note  ____________________________________________  Time seen: Approximately 11:14 AM  I have reviewed the triage vital signs and the nursing notes.   HISTORY  Chief Complaint Shortness of Breath    HPI Randy Chambers is a 81 y.o. male with multiple medical problems including CHF, hypertension, GERD, CVA probably left-sided deficits presents for evaluation of shortness of breath, sudden onset today, mild, constant since onset. No chest pain. He has had mild cough. No fevers. No abdominal pain, no vomiting, diarrhea, fevers or chills. Recently treated for urinary tract infection with Keflex.No modifying factors. He has chronic pleural effusions and is followed by Dr. Meredeth Ide.   Past Medical History  Diagnosis Date  . GERD (gastroesophageal reflux disease)   . Hypertension   . Dysrhythmia   . CHF (congestive heart failure)   . Shortness of breath dyspnea   . Mitral valve disorder   . Angina pectoris   . Stroke     ischemic right  . Coronary artery disease   . Atrial fibrillation     Patient Active Problem List   Diagnosis Date Noted  . CHF (congestive heart failure) 10/09/2014    Past Surgical History  Procedure Laterality Date  . Tonsillectomy    . Cholecystectomy    . Eye surgery Bilateral     cataracts  . Prostate N/A   . Esophagogastroduodenoscopy N/A 06/08/2014    Procedure: ESOPHAGOGASTRODUODENOSCOPY (EGD);  Surgeon: Scot Jun, MD;  Location: [facility] ENDOSCOPY;  Service: Endoscopy;  Laterality: N/A;    No current outpatient prescriptions on file.  Allergies Review of patient's allergies indicates no known allergies.  Family History  Problem Relation Age of Onset  . COPD Other     Social History Social History  Substance Use Topics  . Smoking status: Never Smoker   . Smokeless tobacco: None  . Alcohol Use: None    Review of Systems Constitutional: No fever/chills Eyes: No  visual changes. ENT: No sore throat. Cardiovascular: Denies chest pain. Respiratory: +shortness of breath. Gastrointestinal: No abdominal pain.  No nausea, no vomiting.  No diarrhea.  No constipation. Genitourinary: Negative for dysuria. Musculoskeletal: Negative for back pain. Skin: Negative for rash. Neurological: Negative for headaches, focal weakness or numbness.  10-point ROS otherwise negative.  ____________________________________________   PHYSICAL EXAM:  Filed Vitals:   10/09/2014 1500 2014-10-09 1530 2014-10-09 1609 Oct 09, 2014 1654  BP: 121/64 126/72 111/78 95/55  Pulse: 82  89 80  Temp:      TempSrc:      Resp: 11  24 24   Height:      Weight:    136 lb (61.689 kg)  SpO2: 100%  100% 98%    VITAL SIGNS:  Filed Vitals:   10/09/14 1500 09-Oct-2014 1530 October 09, 2014 1609 10/09/14 1654  BP: 121/64 126/72 111/78 95/55  Pulse: 82  89 80  Temp:      TempSrc:      Resp: 11  24 24   Height:      Weight:    136 lb (61.689 kg)  SpO2: 100%  100% 98%                                                                 Constitutional: Alert and oriented.  Chronically ill appearing but in no acute distress. Eyes: Conjunctivae are normal. PERRL. EOMI. Head: Atraumatic. Nose: No congestion/rhinnorhea. Mouth/Throat: Mucous membranes are moist.  Oropharynx non-erythematous. Neck: No stridor.  Cardiovascular: Normal rate, regular rhythm. Grossly normal heart sounds.  Good peripheral circulation. Respiratory: Normal respiratory effort.  No retractions.  Gastrointestinal: Soft and nontender. No distention. No abdominal bruits. No CVA tenderness. Genitourinary: deferred Musculoskeletal: No lower extremity tenderness nor edema.  No joint effusions. Neurologic:  Normal speech and language. No gross focal neurologic deficits are appreciated. No gait instability. Skin:  Skin is warm, dry and intact. No rash noted. Psychiatric: Mood and affect are normal. Speech and behavior are  normal.  ____________________________________________   LABS (all labs ordered are listed, but only abnormal results are displayed)  Labs Reviewed  CBC WITH DIFFERENTIAL/PLATELET - Abnormal; Notable for the following:    WBC 10.8 (*)    RBC 4.03 (*)    RDW 14.7 (*)    Neutro Abs 8.1 (*)    All other components within normal limits  COMPREHENSIVE METABOLIC PANEL - Abnormal; Notable for the following:    Glucose, Bld 151 (*)    BUN 50 (*)    Creatinine, Ser 1.53 (*)    Calcium 8.6 (*)    Albumin 2.8 (*)    AST 46 (*)    GFR calc non Af Amer 37 (*)    GFR calc Af Amer 42 (*)    All other components within normal limits  TROPONIN I - Abnormal; Notable for the following:    Troponin I 0.04 (*)    All other components within normal limits  BRAIN NATRIURETIC PEPTIDE - Abnormal; Notable for the following:    B Natriuretic Peptide 178.0 (*)    All other components within normal limits  URINALYSIS COMPLETEWITH MICROSCOPIC ([facility] ONLY)  TROPONIN I  TROPONIN I  BASIC METABOLIC PANEL  CBC   ____________________________________________  EKG  ED ECG REPORT I, Gayla Doss, the attending physician, personally viewed and interpreted this ECG.   Date: Sep 27, 2014  EKG Time: 11:07  Rate: 82  Rhythm: atrial fibrillation, rate 82  Axis: normal  Intervals:none  ST&T Change: No acute ST segment elevation.  ____________________________________________  RADIOLOGY  CXR FINDINGS: Small bilateral pleural effusions do not appear changed compared to the most recent exam. There is associated mild basilar atelectasis. Heart size is upper normal. No pulmonary edema is seen. No pneumothorax is identified.  IMPRESSION: No change in small bilateral pleural effusions and basilar atelectasis since the most recent exam. No new abnormality.  ____________________________________________   PROCEDURES  Procedure(s) performed: None  Critical Care performed:  No  ____________________________________________   INITIAL IMPRESSION / ASSESSMENT AND PLAN / ED COURSE  Pertinent labs & imaging results that were available during my care of the patient were reviewed by me and considered in my medical decision making (see chart for details).  Randy Chambers is a 81 y.o. male with multiple medical problems including CHF, hypertension, GERD, CVA probably left-sided deficits presents for evaluation of shortness of breath, sudden onset today, mild, constant since onset. On exam, he is chronically ill-appearing but in no acute distress. He has diminished breath sounds in bilateral bases and chest x-ray consistent with pleural effusions which may be the cause of his new hypoxia, shortness of breath today. He is now requiring 2 L of supplement oxygen to maintain adequate oxygen saturations. Labs are notable for mild leukocytosis. Mild creatinine elevation, mild troponin elevation as well as mild  BNP elevation. Case discussed with hospitalist for admission. ____________________________________________   FINAL CLINICAL IMPRESSION(S) / ED DIAGNOSES  Final diagnoses:  SOB (shortness of breath)      Gayla Doss, MD 10-14-14 [phone]

## 2014-10-05 NOTE — Progress Notes (Signed)
Family would like comfort care no aggressive treatement

## 2014-10-05 NOTE — Progress Notes (Signed)
Date: 2014/09/26,   MRN# [id number] Randy Chambers [birth year] Code Status:     Code Status Orders        Start     Ordered   09/26/2014 1329  Full code   Continuous     Sep 26, 2014 1330    Advance Directive Documentation        Most Recent Value   Type of Advance Directive  Healthcare Power of Attorney   Pre-existing out of facility DNR order (yellow form or pink MOST form)     "MOST" Form in Place?       Hosp day:@LENGTHOFSTAYDAYS @ Referring MD: @         AdmissionWeight: 141 lb 5 oz (64.1 kg)                 CurrentWeight: 136 lb (61.689 kg)  CC: Sob, black stools  HPI: This is a 81 year old male who is known to have chronic diastolic chf, afib, that I saw recently  for bilateral tiny, pleural effusions, suspected cardiac as the source thus far, failure appeared compensated. S/p right side stroke, s/p TPA, residual left side weakness, need assistance with mobility and repositioning in bed. Hospital bed ordered and delivered. Here today with the history of worsening sob, orthopnea but no chest pain, leg pain or edema. His admission chest xray  Showed no significant edema, tiny effusion noted. Spoke with his son. The son mentioned he vomited up black vomitus and had black stools.He is on asa and Eliquis.   PMHX:   Past Medical History  Diagnosis Date  . GERD (gastroesophageal reflux disease)   . Hypertension   . Dysrhythmia   . CHF (congestive heart failure)   . Shortness of breath dyspnea   . Mitral valve disorder   . Angina pectoris   . Stroke     ischemic right  . Coronary artery disease   . Atrial fibrillation    Surgical Hx:  Past Surgical History  Procedure Laterality Date  . Tonsillectomy    . Cholecystectomy    . Eye surgery Bilateral     cataracts  . Prostate N/A   . Esophagogastroduodenoscopy N/A 06/08/2014    Procedure: ESOPHAGOGASTRODUODENOSCOPY (EGD);  Surgeon: Scot Jun, MD;  [facility] END[facility];  Service: Endoscopy;  Laterality: N/A;    Family Hx:  Family History  Problem Relation Age of Onset  . COPD Other    Social Hx:   Social History  Substance Use Topics  . Smoking status: Never Smoker   . Smokeless tobacco: None  . Alcohol Use: None   Medication:    Home Medication:  No current outpatient prescriptions on file.  Current Medication: @   Allergies:  Review of patient's allergies indicates no known allergies.  Review of Systems: Gen:  Denies  fever, sweats, chills HEENT: Denies blurred vision, double vision, ear pain, eye pain, hearing loss, nose bleeds, sore throat Cvc:  No dizziness, chest pain or heaviness Resp:  Sob, no pleurisy, cough  Gi: Denies swallowing difficulty, stomach pain, nausea or vomiting, diarrhea, constipation, bowel incontinence Gu:  Denies bladder incontinence, burning urine Ext:   No Joint pain, stiffness or swelling Skin: No skin rash, easy bruising or bleeding or hives Endoc:  No polyuria, polydipsia , polyphagia or weight change Psych: No depression, insomnia or hallucinations  Other:  All other systems negative  Physical Examination:   VS: BP 95/55 mmHg  Pulse 80  Temp(Src) 97.5 F (36.4 C) (Axillary)  Resp 24  Ht 6' (1.829 m)  Wt 136 lb (61.689 kg)  BMI 18.44 kg/m2  SpO2 98%  General Appearance: No distress  Neuro/psych  speech normal, alert and oriented, cranial nerves 2-12 intact, reflexes normal and symmetric, sensation grossly normal  HEENT: PERRLA, EOM intact, no ptosis, no other lesions noticed  Pulmonary:.No wheezing, No rales, some what coarse breath sounds   Cardiovascular:  Normal S1,S2.  Irrr, soft m      Abdomen:Benign, Soft, non-tender, No masses, hepatosplenomegaly, No lymphadenopathy Endoc: No evident thyromegaly, no signs of acromegaly or Cushing features Skin:   warm, no rashes, no ecchymosis  Extremities: normal, no cyanosis, clubbing, no edema, warm with normal capillary refill. Other findings:   Labs results:   Recent Labs      10/05/14  1139  HGB  13.5  HCT  40.0  MCV  99.2  WBC  10.8*  BUN  50*  CREATININE  1.53*  GLUCOSE  151*  CALCIUM  8.6*  ,   Rad results:   Dg Chest 2 View  10-05-2014   CLINICAL DATA:  Shortness of breath today.  EXAM: CHEST  2 VIEW  COMPARISON:  PA and lateral chest 09/12/2014 and 06/02/2011.  FINDINGS: Small bilateral pleural effusions do not appear changed compared to the most recent exam. There is associated mild basilar atelectasis. Heart size is upper normal. No pulmonary edema is seen. No pneumothorax is identified.  IMPRESSION: No change in small bilateral pleural effusions and basilar atelectasis since the most recent exam. No new abnormality.   Electronically Signed   By: Drusilla Kanner M.D.   On: 10/05/2014 12:06     Assessment and Plan: Bilateral tiny pleural effusion, suspect cardiac as the source thus far, failure appears compensated now. S/p right side stroke, s/p TPA, residual left side weakness,  Black stool ? Gi bleed Dementia Afib, diastolic dysfunction  Plan: cardiology to see guaic stools ppi no thoracentesis resume home meds except eliquis/asa until we r/o bleed o2 Speech eval if not recently done       I have personally obtained a history, examined the patient, evaluated laboratory and imaging results, formulated the assessment and plan and placed orders.  The Patient requires high complexity decision making for assessment and support, frequent evaluation and titration of therapies, application of advanced monitoring technologies and extensive interpretation of multiple databases.   Herbon Fleming,M.D. Pulmonary & Critical care Medicine [facility]

## 2014-10-05 DEATH — deceased

## 2017-04-02 IMAGING — RF DG ESOPHAGUS
7 series · 8 of 8 positions shown · non-contrast
Comparison: None.

CLINICAL DATA: Dysphasia.

EXAM:
ESOPHOGRAM/BARIUM SWALLOW
TECHNIQUE: Single contrast examination was performed using  thin barium.
FLUOROSCOPY TIME:  Fluoroscopy Time:  0 minutes 30 seconds.
Number of Acquired Images:  6

[Series 1: fluoro_barium 2fps_bw · 0.19mm/px · 1 of 1 slices shown (1 of 7)]
[im 1/1]
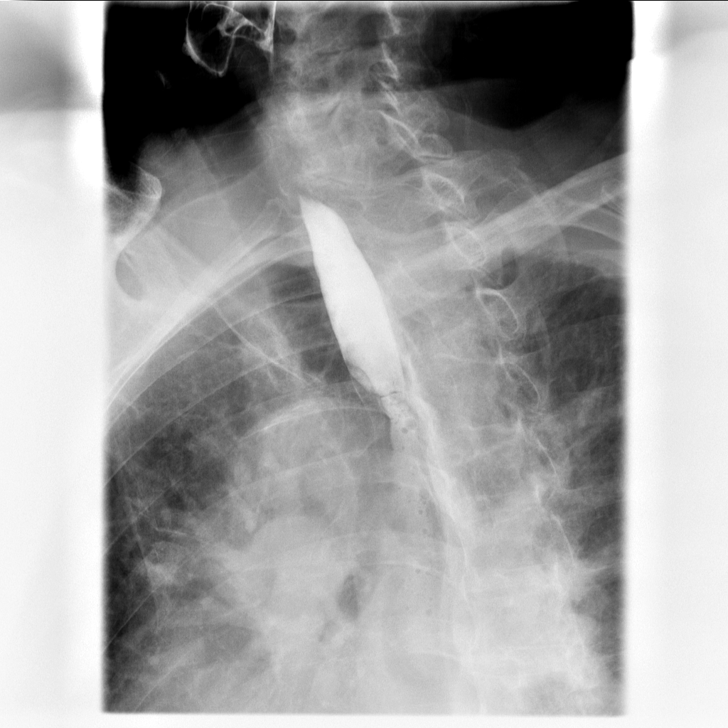

[Series 2: fluoro_barium 2fps_bw · 0.19mm/px · 1 of 1 slices shown (2 of 7)]
[im 1/1]
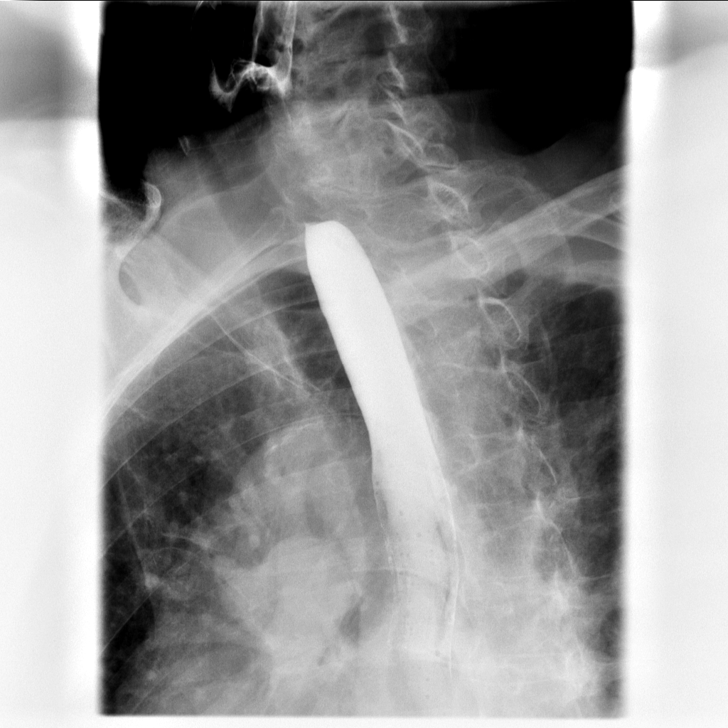

[Series 3: fluoro_barium 2fps_bw · 0.19mm/px · 1 of 1 slices shown (3 of 7)]
[im 1/1]
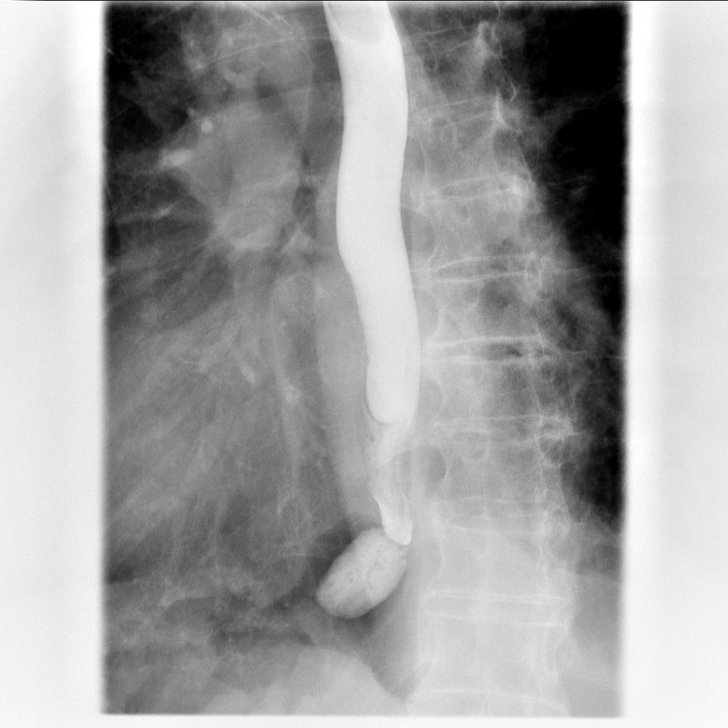

[Series 4: fluoro_barium 2fps_bw · 0.19mm/px · 1 of 1 slices shown (4 of 7)]
[im 1/1]
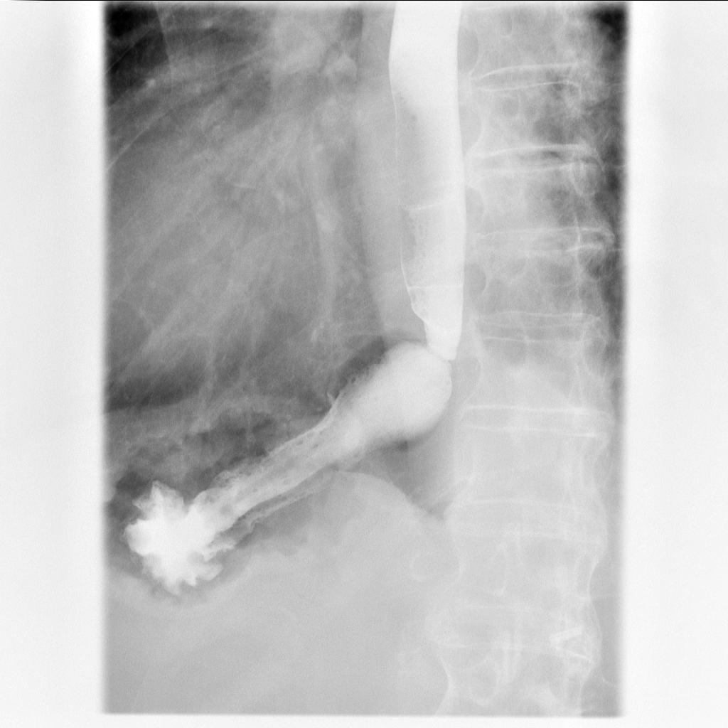

[Series 5: fluoro_barium 2fps_bw · 0.19mm/px · 2 of 2 frames shown (5 of 7)]
[frame 1/2]
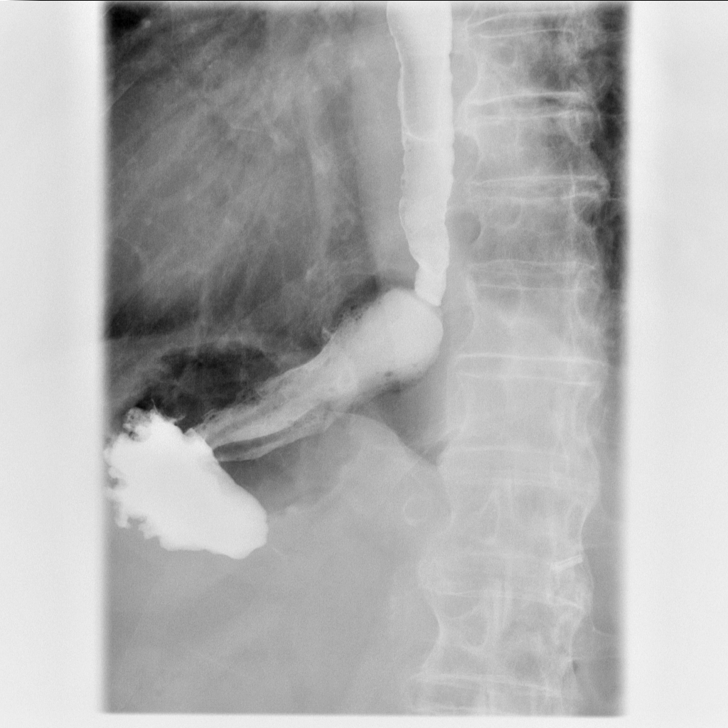
[frame 2/2]
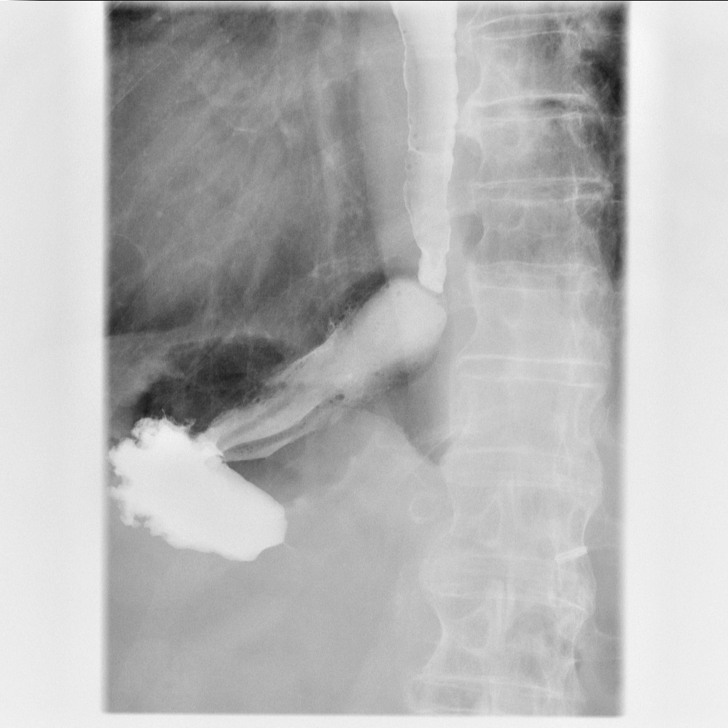

[Series 6: fluoro_barium 2fps_bw · 0.20mm/px · 1 of 1 slices shown (6 of 7)]
[im 1/1]
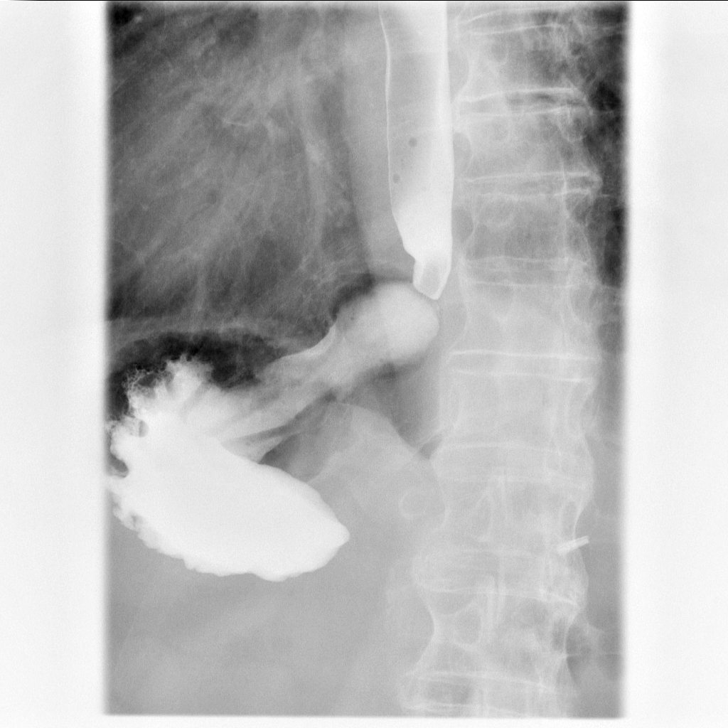

[Series 7: fluoro_barium 2fps_bw · 0.20mm/px · 1 of 1 slices shown (7 of 7)]
[im 1/1]
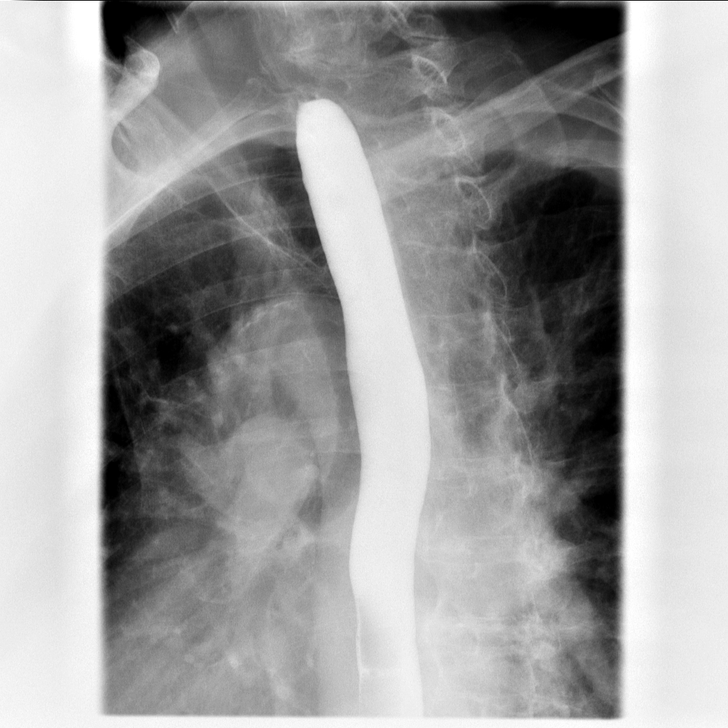

[8 of 8 positions shown; findings below may reference images not displayed]

FINDINGS: Exam limited by patient's age. Small sliding hiatal hernia with a
prominent B ring with prominent narrowing noted. Normal peristalsis.
No prominent reflux present. No barium tablet administered due to
the patient's age.
IMPRESSION: Small sliding hernia with prominent B ring.
